# Patient Record
Sex: Male | Born: 1960 | Race: White | Hispanic: No | Marital: Married | Smoking: Former smoker
Health system: Southern US, Community
[De-identification: ages and names within clinical notes are randomized; demographics above are authoritative.]

## PROBLEM LIST (undated history)

## (undated) DIAGNOSIS — M1990 Unspecified osteoarthritis, unspecified site: Secondary | ICD-10-CM

## (undated) DIAGNOSIS — Z9889 Other specified postprocedural states: Secondary | ICD-10-CM

## (undated) DIAGNOSIS — R112 Nausea with vomiting, unspecified: Secondary | ICD-10-CM

## (undated) DIAGNOSIS — Z5189 Encounter for other specified aftercare: Secondary | ICD-10-CM

## (undated) DIAGNOSIS — E119 Type 2 diabetes mellitus without complications: Secondary | ICD-10-CM

---

## 1980-02-11 HISTORY — PX: VASECTOMY: SHX75

## 1998-10-09 ENCOUNTER — Inpatient Hospital Stay (HOSPITAL_COMMUNITY): Admission: RE | Admit: 1998-10-09 | Discharge: 1998-10-17 | Payer: Self-pay | Admitting: Neurosurgery

## 1998-10-09 ENCOUNTER — Encounter: Payer: Self-pay | Admitting: Neurosurgery

## 1999-02-11 HISTORY — PX: TYMPANIC MEMBRANE REPAIR: SHX294

## 1999-10-29 ENCOUNTER — Encounter: Payer: Self-pay | Admitting: Neurosurgery

## 1999-10-29 ENCOUNTER — Ambulatory Visit (HOSPITAL_COMMUNITY): Admission: RE | Admit: 1999-10-29 | Discharge: 1999-10-29 | Payer: Self-pay | Admitting: Neurosurgery

## 2003-02-11 HISTORY — PX: BACK SURGERY: SHX140

## 2004-07-16 ENCOUNTER — Emergency Department (HOSPITAL_COMMUNITY): Admission: EM | Admit: 2004-07-16 | Discharge: 2004-07-16 | Payer: Self-pay | Admitting: Emergency Medicine

## 2005-02-03 ENCOUNTER — Emergency Department (HOSPITAL_COMMUNITY): Admission: EM | Admit: 2005-02-03 | Discharge: 2005-02-03 | Payer: Self-pay | Admitting: Emergency Medicine

## 2006-10-21 ENCOUNTER — Encounter: Admission: RE | Admit: 2006-10-21 | Discharge: 2006-10-21 | Payer: Self-pay | Admitting: Orthopedic Surgery

## 2007-02-11 DIAGNOSIS — IMO0001 Reserved for inherently not codable concepts without codable children: Secondary | ICD-10-CM

## 2007-02-11 DIAGNOSIS — Z5189 Encounter for other specified aftercare: Secondary | ICD-10-CM

## 2007-02-11 HISTORY — PX: JOINT REPLACEMENT: SHX530

## 2007-02-11 HISTORY — DX: Encounter for other specified aftercare: Z51.89

## 2007-02-11 HISTORY — DX: Reserved for inherently not codable concepts without codable children: IMO0001

## 2007-04-09 ENCOUNTER — Ambulatory Visit (HOSPITAL_COMMUNITY): Admission: RE | Admit: 2007-04-09 | Discharge: 2007-04-09 | Payer: Self-pay | Admitting: Family Medicine

## 2007-06-03 ENCOUNTER — Inpatient Hospital Stay (HOSPITAL_COMMUNITY): Admission: RE | Admit: 2007-06-03 | Discharge: 2007-06-06 | Payer: Self-pay | Admitting: Orthopedic Surgery

## 2010-06-25 NOTE — Op Note (Signed)
NAME:  Richard Morris, Richard Morris               ACCOUNT NO.:  [id number]   MEDICAL RECORD NO.:  [id number]          PATIENT TYPE:  INP   LOCATION:  0004                         FACILITY:  [facility]   PHYSICIAN:  John L. Rendall, M.D.  DATE OF BIRTH:  11/18/1959   DATE OF PROCEDURE:  06/03/2007  DATE OF DISCHARGE:                               OPERATIVE REPORT   PREOPERATIVE DIAGNOSIS:  Osteoarthritis left hip.   POSTOPERATIVE DIAGNOSIS:  Osteoarthritis left hip.   SURGICAL PROCEDURES:  Left AML total hip replacement.   SURGEON:  John L. Rendall, M.D.   ASSISTANT:  Legrand Pitts. Duffy, P.A.   ANESTHESIA:  General.   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  He has a very vertical  femoral neck and it is longer than usual.   PROCEDURE:  Under general anesthesia, the patient is placed in the right  lateral decubitus position and the hip is prepared with DuraPrep.  posterior approach is made, splitting the IT band in the line of its  fibers using an approximate 6 inch incision.  Dissection is carried down  through skin, subcutaneous tissue and IT band, splitting it in the line  of its fibers inserting a Charnley retractor.  Short external rotators  and hip capsule are taken down from the bone at the femoral neck.  Capsule is teased from the short external rotators with a Cobb elevator  and then split in a T-shaped manner.  The hip is then dislocated after  bleeding is controlled.  The superior femoral neck is exposed and IM  initiator and canal finder are used.  Progressive reaming up to 13 mm is  performed.  Once this is done in preparation for a 13.5, the femoral  neck is osteotomized about an inch above the lesser trochanter.  At this  point, the femoral head is removed and rasping is done with 10.5 narrow,  12 and 13.5 and with a calcar reamer, the 13.5 gave a virtual perfect  seating on the calcar.  At this point, the  acetabulum is exposed with  cobra retractors inferiorly and wing retractors superiorly.  The labrum  is debrided.  The reamers are then used in sequence 45, 47, 49, 50, 51,  52 and 53.  Trial seating of a 52 bottoms out nicely.  Attempted seating  of a 54 gives excellent rim fit and at this point a #54 100 series  acetabular component is obtained using the Sputnik external guide.  It  is seated and leaves some exposed bone inferiorly laterally.  This is  soon found to cause subluxation of the hip on internal rotation and  despite several changes of neck lengths and trying the anteverted stem,  the acetabulum had to be redirected.  It is disimpacted and reinserted,  reinserting it only about 3 mm from full seating to see if the new  position was correct and it was not quite correct.  It was then reamed  to fit the more anatomic original acetabulum and with it seated in  this  manner, the Prodigy stem fit nicely with a +13  #32 femoral head inside  a 54 pinnacle cup with acetabular liner lipped inside diameter 32,  outside 54.  With excellent fitting of the trial reduction, the trial  acetabular poly is removed and a apex hole eliminator is inserted.  The  Marathon poly is then inserted.  The femoral component is then seated  nicely on the calcar and the +13 neck length hip ball 32 mm is impacted.  Care is taken to remove all debris from the acetabulum and hip is  reduced.  At this point excellent fit, alignment, stability are  obtained.  The hip capsule was preserved.  It is closed with #1 Tycron,  piriformis reattached with #1 Tycron, fascia closed with #1 Tycron,  subcu with #1 Vicryl, 2-0 Vicryl and skin clips.   OPERATIVE TIME:  About an hour and 45 minutes.   BLOOD LOSS:  Estimated at 400 mL.   The patient tolerated the procedure well and returned to recovery in  good condition.      John L. Rendall, M.D.  Electronically Signed     JLR/MEDQ  D:  06/03/2007  T:  06/03/2007   Job:  [id number]
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
## 2010-11-05 LAB — URINALYSIS, ROUTINE W REFLEX MICROSCOPIC
Glucose, UA: NEGATIVE
Hgb urine dipstick: NEGATIVE
pH: 7

## 2010-11-05 LAB — COMPREHENSIVE METABOLIC PANEL
Alkaline Phosphatase: 71
BUN: 10
GFR calc non Af Amer: >60
Glucose, Bld: 97
Potassium: 4.3
Total Bilirubin: 0.7
Total Protein: 7

## 2010-11-05 LAB — CROSSMATCH: ABO/RH(D): O POS

## 2010-11-05 LAB — CBC
HCT: 43.4
Hemoglobin: 14.9
MCHC: 34.3
MCHC: 35
Platelets: 216
RBC: 3.28 — ABNORMAL LOW
RBC: 3.3 — ABNORMAL LOW
RDW: 13.5
RDW: 14
WBC: 10.3
WBC: 9.3

## 2010-11-05 LAB — BASIC METABOLIC PANEL
BUN: 5 — ABNORMAL LOW
CO2: 27
Calcium: 8.2 — ABNORMAL LOW
Creatinine, Ser: 0.87
Creatinine, Ser: 0.94
GFR calc Af Amer: >60
GFR calc non Af Amer: >60
GFR calc non Af Amer: >60
Potassium: 3.8

## 2010-11-05 LAB — URINE CULTURE
Colony Count: NO GROWTH
Culture: NO GROWTH
Special Requests: NEGATIVE

## 2010-11-05 LAB — ABO/RH: ABO/RH(D): O POS

## 2010-11-05 LAB — POCT I-STAT 4, (NA,K, GLUC, HGB,HCT)
Glucose, Bld: 134 — ABNORMAL HIGH
HCT: 35 — ABNORMAL LOW
Hemoglobin: 11.9 — ABNORMAL LOW
Potassium: 4.4

## 2010-11-05 LAB — HEMOGLOBIN AND HEMATOCRIT, BLOOD
HCT: 27.3 — ABNORMAL LOW
Hemoglobin: 9.5 — ABNORMAL LOW

## 2010-11-05 LAB — DIFFERENTIAL
Eosinophils Absolute: 0.2
Eosinophils Relative: 3
Lymphocytes Relative: 33
Lymphs Abs: 2.5
Monocytes Absolute: 0.5

## 2011-05-02 ENCOUNTER — Other Ambulatory Visit: Payer: Self-pay | Admitting: Orthopaedic Surgery

## 2011-05-19 ENCOUNTER — Other Ambulatory Visit (HOSPITAL_COMMUNITY): Payer: Self-pay | Admitting: *Deleted

## 2011-05-20 ENCOUNTER — Encounter (HOSPITAL_COMMUNITY): Payer: Self-pay

## 2011-05-20 ENCOUNTER — Encounter (HOSPITAL_COMMUNITY)
Admission: RE | Admit: 2011-05-20 | Discharge: 2011-05-20 | Disposition: A | Discharge status: Home / Self Care | Payer: Managed Care, Other (non HMO) | Source: Ambulatory Visit | Attending: Orthopaedic Surgery | Admitting: Orthopaedic Surgery

## 2011-05-20 ENCOUNTER — Other Ambulatory Visit: Payer: Self-pay | Admitting: Orthopaedic Surgery

## 2011-05-20 ENCOUNTER — Encounter (HOSPITAL_COMMUNITY): Payer: Self-pay | Admitting: Pharmacy Technician

## 2011-05-20 HISTORY — DX: Nausea with vomiting, unspecified: R11.2

## 2011-05-20 HISTORY — DX: Unspecified osteoarthritis, unspecified site: M19.90

## 2011-05-20 HISTORY — DX: Encounter for other specified aftercare: Z51.89

## 2011-05-20 HISTORY — DX: Other specified postprocedural states: Z98.890

## 2011-05-20 LAB — TYPE AND SCREEN
ABO/RH(D): O POS
Antibody Screen: NEGATIVE

## 2011-05-20 LAB — BASIC METABOLIC PANEL
BUN: 13 mg/dL (ref 6–23)
CO2: 27 mEq/L (ref 19–32)
Calcium: 9.7 mg/dL (ref 8.4–10.5)
Chloride: 101 mEq/L (ref 96–112)
Creatinine, Ser: 0.97 mg/dL (ref 0.50–1.35)
GFR calc Af Amer: >90 mL/min (ref >90)
GFR calc non Af Amer: >90 mL/min (ref >90)
Glucose, Bld: 84 mg/dL (ref 70–99)
Potassium: 4.4 mEq/L (ref 3.5–5.1)
Sodium: 137 mEq/L (ref 135–145)

## 2011-05-20 LAB — URINALYSIS, ROUTINE W REFLEX MICROSCOPIC
Glucose, UA: NEGATIVE mg/dL
Ketones, ur: NEGATIVE mg/dL
Leukocytes, UA: NEGATIVE
Nitrite: NEGATIVE
Protein, ur: NEGATIVE mg/dL
Urobilinogen, UA: 0.2 mg/dL (ref 0.0–1.0)

## 2011-05-20 LAB — CBC
HCT: 41.5 % (ref 39.0–52.0)
Hemoglobin: 14.5 g/dL (ref 13.0–17.0)
MCH: 28.9 pg (ref 26.0–34.0)
MCHC: 34.9 g/dL (ref 30.0–36.0)
MCV: 82.7 fL (ref 78.0–100.0)
Platelets: 250 10*3/uL (ref 150–400)
RBC: 5.02 MIL/uL (ref 4.22–5.81)
RDW: 13.4 % (ref 11.5–15.5)
WBC: 7.9 10*3/uL (ref 4.0–10.5)

## 2011-05-20 LAB — DIFFERENTIAL
Basophils Absolute: 0.1 10*3/uL (ref 0.0–0.1)
Basophils Relative: 1 % (ref 0–1)
Eosinophils Absolute: 0.2 10*3/uL (ref 0.0–0.7)
Eosinophils Relative: 3 % (ref 0–5)
Lymphocytes Relative: 39 % (ref 12–46)
Lymphs Abs: 3.1 10*3/uL (ref 0.7–4.0)
Monocytes Absolute: 0.6 10*3/uL (ref 0.1–1.0)
Monocytes Relative: 8 % (ref 3–12)
Neutro Abs: 3.9 10*3/uL (ref 1.7–7.7)
Neutrophils Relative %: 49 % (ref 43–77)

## 2011-05-20 LAB — PROTIME-INR
INR: 0.93 (ref 0.00–1.49)
Prothrombin Time: 12.7 seconds (ref 11.6–15.2)

## 2011-05-20 LAB — SURGICAL PCR SCREEN
MRSA, PCR: NEGATIVE
Staphylococcus aureus: NEGATIVE

## 2011-05-20 LAB — APTT: aPTT: 32 seconds (ref 24–37)

## 2011-05-20 NOTE — Pre-Procedure Instructions (Signed)
20 Richard Morris  05/20/2011   Your procedure is scheduled on: Tuesday, April 16  Report to [facility] at 0530 AM.  Call this number if you have problems the morning of surgery: [phone]   Remember:   Do not eat food:After Midnight.  May have clear liquids: up to 4 Hours before arrival.(1:30 am)  Clear liquids include soda, tea, black coffee, apple or grape juice, broth.  Take these medicines the morning of surgery with A SIP OF WATER: *none**   Do not wear jewelry, make-up or nail polish.  Do not wear lotions, powders, or perfumes. You may wear deodorant.  Do not shave 48 hours prior to surgery.  Do not bring valuables to the hospital.  Contacts, dentures or bridgework may not be worn into surgery.  Leave suitcase in the car. After surgery it may be brought to your room.  For patients admitted to the hospital, checkout time is 11:00 AM the day of discharge.   Patients discharged the day of surgery will not be allowed to drive home.  Name and phone number of your driver: *n/a**  Special Instructions: Incentive Spirometry - Practice and bring it with you on the day of surgery. and CHG Shower Use Special Wash: 1/2 bottle night before surgery and 1/2 bottle morning of surgery.   Please read over the following fact sheets that you were given: Pain Booklet, Coughing and Deep Breathing, Blood Transfusion Information, Total Joint Packet, MRSA Information and Surgical Site Infection Prevention

## 2011-05-23 NOTE — H&P (Signed)
Richard Morris is an 51 y.o. male.   Chief Complaint: right knee pain HPI: Richard Morris is a patient well-known to our practice.  He is complaining of increasing right knee pain that has gotten to the point that it is severe and limiting his activities.  He is having pain with every step and night pain.  He has tried anti-inflammatory pills and cortisone injections all of which provided minimal benefit.  He has also had a series of Supartz done which helped only temporarily.  He has undergone a previous knee arthroscopy in 2011 which was positive for DJD. Radiographs:   AP, lateral and von Rosen views show bone-on-bone end-stage DJD of his right knee. We have discussed treatment options and he would like to proceed with a total knee replacement on this side.  Past Medical History  Diagnosis Date  . PONV (postoperative nausea and vomiting)   . Blood transfusion 2009    s/p hip replacement  . Arthritis     degenerative joint disease    Past Surgical History  Procedure Date  . Joint replacement 2009    left hip  . Back surgery 2005    spinal cyst  . Tympanic membrane repair 2001  . Vasectomy 1982    Family History  Problem Relation Age of Onset  . Anesthesia problems Neg Hx    Social History:  reports that he has quit smoking. His smoking use included Cigarettes. He has a 30 pack-year smoking history. He does not have any smokeless tobacco history on file. He reports that he drinks about 1.2 ounces of alcohol per week. He reports that he does not use illicit drugs.  Allergies: No Known Allergies  No current facility-administered medications on file as of .   No current outpatient prescriptions on file as of .    No results found for this or any previous visit (from the past 48 hour(s)). No results found.  Review of Systems  Constitutional: Negative.   HENT: Negative.   Respiratory: Negative.   Cardiovascular: Negative.   Genitourinary: Negative.   Musculoskeletal: Negative.     Skin: Negative.   Neurological: Negative.   Psychiatric/Behavioral: Negative.     There were no vitals taken for this visit. Physical Exam  Constitutional: He appears well-developed.  HENT:  Head: Normocephalic.  Eyes: Pupils are equal, round, and reactive to light.  Neck: Normal range of motion.  Cardiovascular: Normal heart sounds.   Respiratory: Effort normal.  GI: Soft. Bowel sounds are normal.  Musculoskeletal:       Right knee exam: Range of motion 0-105.  Pain along the medial joint line with 1+ crepitation.  Normal neurovascular status distally.  Neurological: He is alert.  Skin: Skin is warm.  Psychiatric: He has a normal mood and affect.     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  We have discussed complications which include anesthesia, infection, DVT and possible death.  He will be admitted postoperatively for pain control and therapy.  Synai Prettyman R 05/23/2011, 12:35 PM

## 2011-05-26 MED ORDER — CEFAZOLIN SODIUM-DEXTROSE 2-3 GM-% IV SOLR
2.0000 g | INTRAVENOUS | Status: AC
  Administered 2011-05-27: 2 g via INTRAVENOUS
  Filled 2011-05-26: qty 50

## 2011-05-26 MED ORDER — CHLORHEXIDINE GLUCONATE 4 % EX LIQD: 60.0000 mL | Freq: Once | CUTANEOUS | Status: DC

## 2011-05-27 ENCOUNTER — Encounter (HOSPITAL_COMMUNITY): Payer: Self-pay | Admitting: Anesthesiology

## 2011-05-27 ENCOUNTER — Inpatient Hospital Stay (HOSPITAL_COMMUNITY)
Admission: RE | Admit: 2011-05-27 | Discharge: 2011-05-30 | DRG: 470 | Disposition: A | Discharge status: Home Health | Payer: Managed Care, Other (non HMO) | Source: Ambulatory Visit | Attending: Orthopaedic Surgery | Admitting: Orthopaedic Surgery

## 2011-05-27 ENCOUNTER — Encounter (HOSPITAL_COMMUNITY): Payer: Self-pay | Admitting: *Deleted

## 2011-05-27 ENCOUNTER — Encounter (HOSPITAL_COMMUNITY)
Admission: RE | Disposition: A | Discharge status: Home Health | Payer: Self-pay | Source: Ambulatory Visit | Attending: Orthopaedic Surgery

## 2011-05-27 ENCOUNTER — Ambulatory Visit (HOSPITAL_COMMUNITY): Payer: Managed Care, Other (non HMO) | Admitting: Anesthesiology

## 2011-05-27 DIAGNOSIS — M1711 Unilateral primary osteoarthritis, right knee: Secondary | ICD-10-CM | POA: Diagnosis present

## 2011-05-27 DIAGNOSIS — Z7901 Long term (current) use of anticoagulants: Secondary | ICD-10-CM

## 2011-05-27 DIAGNOSIS — Z96649 Presence of unspecified artificial hip joint: Secondary | ICD-10-CM

## 2011-05-27 DIAGNOSIS — Z79899 Other long term (current) drug therapy: Secondary | ICD-10-CM

## 2011-05-27 DIAGNOSIS — Z87891 Personal history of nicotine dependence: Secondary | ICD-10-CM

## 2011-05-27 DIAGNOSIS — Z01818 Encounter for other preprocedural examination: Secondary | ICD-10-CM

## 2011-05-27 DIAGNOSIS — M1710 Unilateral primary osteoarthritis, unspecified knee: Principal | ICD-10-CM | POA: Diagnosis present

## 2011-05-27 DIAGNOSIS — M129 Arthropathy, unspecified: Secondary | ICD-10-CM | POA: Diagnosis present

## 2011-05-27 DIAGNOSIS — Z01812 Encounter for preprocedural laboratory examination: Secondary | ICD-10-CM

## 2011-05-27 HISTORY — PX: TOTAL KNEE ARTHROPLASTY: SHX125

## 2011-05-27 SURGERY — ARTHROPLASTY, KNEE, TOTAL
Anesthesia: General | Site: Knee | Laterality: Right | Wound class: Clean

## 2011-05-27 MED ORDER — LACTATED RINGERS IV SOLN: INTRAVENOUS | Status: DC

## 2011-05-27 MED ORDER — HYDROMORPHONE HCL PF 1 MG/ML IJ SOLN: 0.2500 mg | INTRAMUSCULAR | Status: DC | PRN

## 2011-05-27 MED ORDER — CEFUROXIME SODIUM 1.5 G IJ SOLR
INTRAMUSCULAR | Status: DC | PRN
  Administered 2011-05-27: 1.5 g

## 2011-05-27 MED ORDER — METOCLOPRAMIDE HCL 5 MG/ML IJ SOLN: 5.0000 mg | Freq: Three times a day (TID) | INTRAMUSCULAR | Status: DC | PRN

## 2011-05-27 MED ORDER — CEFAZOLIN SODIUM-DEXTROSE 2-3 GM-% IV SOLR
2.0000 g | Freq: Four times a day (QID) | INTRAVENOUS | Status: AC
  Administered 2011-05-27 – 2011-05-28 (×3): 2 g via INTRAVENOUS
  Filled 2011-05-27 (×3): qty 50

## 2011-05-27 MED ORDER — BISACODYL 5 MG PO TBEC: 5.0000 mg | DELAYED_RELEASE_TABLET | Freq: Every day | ORAL | Status: DC | PRN

## 2011-05-27 MED ORDER — LACTATED RINGERS IV SOLN
INTRAVENOUS | Status: DC | PRN
  Administered 2011-05-27 (×2): via INTRAVENOUS

## 2011-05-27 MED ORDER — ONDANSETRON HCL 4 MG/2ML IJ SOLN: 4.0000 mg | Freq: Four times a day (QID) | INTRAMUSCULAR | Status: DC | PRN

## 2011-05-27 MED ORDER — ACETAMINOPHEN 650 MG RE SUPP: 650.0000 mg | Freq: Four times a day (QID) | RECTAL | Status: DC | PRN

## 2011-05-27 MED ORDER — PHENOL 1.4 % MT LIQD: 1.0000 | OROMUCOSAL | Status: DC | PRN

## 2011-05-27 MED ORDER — DIPHENHYDRAMINE HCL 12.5 MG/5ML PO ELIX: 12.5000 mg | ORAL_SOLUTION | ORAL | Status: DC | PRN

## 2011-05-27 MED ORDER — WARFARIN - PHARMACIST DOSING INPATIENT: Freq: Every day | Status: DC

## 2011-05-27 MED ORDER — ROCURONIUM BROMIDE 100 MG/10ML IV SOLN
INTRAVENOUS | Status: DC | PRN
  Administered 2011-05-27: 50 mg via INTRAVENOUS

## 2011-05-27 MED ORDER — HYDROMORPHONE HCL PF 1 MG/ML IJ SOLN: 0.5000 mg | INTRAMUSCULAR | Status: DC | PRN

## 2011-05-27 MED ORDER — PATIENT'S GUIDE TO USING COUMADIN BOOK
Freq: Once | Status: AC
  Administered 2011-05-27: 18:00:00
  Filled 2011-05-27: qty 1

## 2011-05-27 MED ORDER — DEXAMETHASONE SODIUM PHOSPHATE 10 MG/ML IJ SOLN
INTRAMUSCULAR | Status: DC | PRN
  Administered 2011-05-27: 8 mg via INTRAVENOUS

## 2011-05-27 MED ORDER — ALUM & MAG HYDROXIDE-SIMETH 200-200-20 MG/5ML PO SUSP: 30.0000 mL | ORAL | Status: DC | PRN

## 2011-05-27 MED ORDER — METOCLOPRAMIDE HCL 10 MG PO TABS: 5.0000 mg | ORAL_TABLET | Freq: Three times a day (TID) | ORAL | Status: DC | PRN

## 2011-05-27 MED ORDER — MAGNESIUM CITRATE PO SOLN
1.0000 | Freq: Once | ORAL | Status: AC | PRN
  Filled 2011-05-27: qty 296

## 2011-05-27 MED ORDER — LACTATED RINGERS IV SOLN
INTRAVENOUS | Status: DC
  Administered 2011-05-27: 14:00:00 via INTRAVENOUS

## 2011-05-27 MED ORDER — DOCUSATE SODIUM 100 MG PO CAPS
100.0000 mg | ORAL_CAPSULE | Freq: Two times a day (BID) | ORAL | Status: DC
  Administered 2011-05-27 – 2011-05-30 (×6): 100 mg via ORAL
  Filled 2011-05-27 (×7): qty 1

## 2011-05-27 MED ORDER — GLYCOPYRROLATE 0.2 MG/ML IJ SOLN
INTRAMUSCULAR | Status: DC | PRN
  Administered 2011-05-27: .8 mg via INTRAVENOUS

## 2011-05-27 MED ORDER — METHOCARBAMOL 500 MG PO TABS
500.0000 mg | ORAL_TABLET | Freq: Four times a day (QID) | ORAL | Status: DC | PRN
  Administered 2011-05-27 – 2011-05-30 (×3): 500 mg via ORAL
  Filled 2011-05-27 (×3): qty 1

## 2011-05-27 MED ORDER — FENTANYL CITRATE 0.05 MG/ML IJ SOLN
INTRAMUSCULAR | Status: DC | PRN
  Administered 2011-05-27: 100 ug via INTRAVENOUS
  Administered 2011-05-27 (×2): 50 ug via INTRAVENOUS
  Administered 2011-05-27 (×2): 100 ug via INTRAVENOUS

## 2011-05-27 MED ORDER — METHOCARBAMOL 100 MG/ML IJ SOLN
500.0000 mg | Freq: Four times a day (QID) | INTRAVENOUS | Status: DC | PRN
  Filled 2011-05-27: qty 5

## 2011-05-27 MED ORDER — LIDOCAINE HCL (CARDIAC) 20 MG/ML IV SOLN
INTRAVENOUS | Status: DC | PRN
  Administered 2011-05-27: 100 mg via INTRAVENOUS

## 2011-05-27 MED ORDER — SODIUM CHLORIDE 0.9 % IR SOLN
Status: DC | PRN
  Administered 2011-05-27: 1000 mL
  Administered 2011-05-27: 3000 mL

## 2011-05-27 MED ORDER — ACETAMINOPHEN 325 MG PO TABS: 650.0000 mg | ORAL_TABLET | Freq: Four times a day (QID) | ORAL | Status: DC | PRN

## 2011-05-27 MED ORDER — ENOXAPARIN SODIUM 30 MG/0.3ML ~~LOC~~ SOLN
30.0000 mg | Freq: Two times a day (BID) | SUBCUTANEOUS | Status: DC
  Administered 2011-05-28 – 2011-05-30 (×5): 30 mg via SUBCUTANEOUS
  Filled 2011-05-27 (×8): qty 0.3

## 2011-05-27 MED ORDER — PROPOFOL 10 MG/ML IV EMUL
INTRAVENOUS | Status: DC | PRN
  Administered 2011-05-27: 200 mg via INTRAVENOUS

## 2011-05-27 MED ORDER — NEOSTIGMINE METHYLSULFATE 1 MG/ML IJ SOLN
INTRAMUSCULAR | Status: DC | PRN
  Administered 2011-05-27: 5 mg via INTRAVENOUS

## 2011-05-27 MED ORDER — MIDAZOLAM HCL 5 MG/5ML IJ SOLN
INTRAMUSCULAR | Status: DC | PRN
  Administered 2011-05-27 (×2): 2 mg via INTRAVENOUS

## 2011-05-27 MED ORDER — WARFARIN SODIUM 10 MG PO TABS
10.0000 mg | ORAL_TABLET | Freq: Once | ORAL | Status: AC
  Administered 2011-05-27: 10 mg via ORAL
  Filled 2011-05-27: qty 1

## 2011-05-27 MED ORDER — ONDANSETRON HCL 4 MG PO TABS: 4.0000 mg | ORAL_TABLET | Freq: Four times a day (QID) | ORAL | Status: DC | PRN

## 2011-05-27 MED ORDER — ONDANSETRON HCL 4 MG/2ML IJ SOLN: 4.0000 mg | Freq: Once | INTRAMUSCULAR | Status: DC | PRN

## 2011-05-27 MED ORDER — ACETAMINOPHEN 10 MG/ML IV SOLN
INTRAVENOUS | Status: AC
  Filled 2011-05-27: qty 100

## 2011-05-27 MED ORDER — OXYCODONE-ACETAMINOPHEN 5-325 MG PO TABS
1.0000 | ORAL_TABLET | ORAL | Status: DC | PRN
  Administered 2011-05-27 – 2011-05-29 (×7): 2 via ORAL
  Administered 2011-05-29: 1 via ORAL
  Administered 2011-05-30 (×2): 2 via ORAL
  Filled 2011-05-27 (×4): qty 2
  Filled 2011-05-27: qty 1
  Filled 2011-05-27 (×5): qty 2

## 2011-05-27 MED ORDER — MENTHOL 3 MG MT LOZG: 1.0000 | LOZENGE | OROMUCOSAL | Status: DC | PRN

## 2011-05-27 MED ORDER — ONDANSETRON HCL 4 MG/2ML IJ SOLN
INTRAMUSCULAR | Status: DC | PRN
  Administered 2011-05-27 (×2): 4 mg via INTRAVENOUS

## 2011-05-27 MED ORDER — WARFARIN VIDEO: Freq: Once | Status: DC

## 2011-05-27 SURGICAL SUPPLY — 66 items
AUTOTRANSFUSION W/QD PVC DRAIN (AUTOTRANSFUSION) ×2 IMPLANT
BANDAGE ELASTIC 4 VELCRO ST LF (GAUZE/BANDAGES/DRESSINGS) ×2 IMPLANT
BANDAGE ESMARK 6X9 LF (GAUZE/BANDAGES/DRESSINGS) ×1 IMPLANT
BANDAGE GAUZE ELAST BULKY 4 IN (GAUZE/BANDAGES/DRESSINGS) ×2 IMPLANT
BLADE SAGITTAL 25.0X1.19X90 (BLADE) ×2 IMPLANT
BLADE SURG ROTATE 9660 (MISCELLANEOUS) IMPLANT
BNDG ELASTIC 6X10 VLCR STRL LF (GAUZE/BANDAGES/DRESSINGS) ×2 IMPLANT
BNDG ESMARK 6X9 LF (GAUZE/BANDAGES/DRESSINGS) ×2
BOWL SMART MIX CTS (DISPOSABLE) ×2 IMPLANT
CEMENT HV SMART SET (Cement) ×4 IMPLANT
CLOTH BEACON ORANGE TIMEOUT ST (SAFETY) ×2 IMPLANT
COVER SURGICAL LIGHT HANDLE (MISCELLANEOUS) ×2 IMPLANT
CUFF TOURNIQUET SINGLE 34IN LL (TOURNIQUET CUFF) ×2 IMPLANT
CUFF TOURNIQUET SINGLE 44IN (TOURNIQUET CUFF) IMPLANT
DRAPE EXTREMITY T 121X128X90 (DRAPE) ×2 IMPLANT
DRAPE PROXIMA HALF (DRAPES) ×2 IMPLANT
DRAPE U-SHAPE 47X51 STRL (DRAPES) ×2 IMPLANT
DRSG ADAPTIC 3X8 NADH LF (GAUZE/BANDAGES/DRESSINGS) ×2 IMPLANT
DRSG PAD ABDOMINAL 8X10 ST (GAUZE/BANDAGES/DRESSINGS) ×2 IMPLANT
DURAPREP 26ML APPLICATOR (WOUND CARE) ×2 IMPLANT
ELECT REM PT RETURN 9FT ADLT (ELECTROSURGICAL) ×2
ELECTRODE REM PT RTRN 9FT ADLT (ELECTROSURGICAL) ×1 IMPLANT
EVACUATOR 1/8 PVC DRAIN (DRAIN) ×2 IMPLANT
FACESHIELD LNG OPTICON STERILE (SAFETY) ×2 IMPLANT
GLOVE BIO SURGEON STRL SZ8.5 (GLOVE) ×2 IMPLANT
GLOVE BIOGEL PI IND STRL 7.5 (GLOVE) ×1 IMPLANT
GLOVE BIOGEL PI IND STRL 8 (GLOVE) ×1 IMPLANT
GLOVE BIOGEL PI IND STRL 8.5 (GLOVE) ×1 IMPLANT
GLOVE BIOGEL PI INDICATOR 7.5 (GLOVE) ×1
GLOVE BIOGEL PI INDICATOR 8 (GLOVE) ×1
GLOVE BIOGEL PI INDICATOR 8.5 (GLOVE) ×1
GLOVE EXAM NITRILE LRG STRL (GLOVE) ×2 IMPLANT
GLOVE SS BIOGEL STRL SZ 8 (GLOVE) ×1 IMPLANT
GLOVE SUPERSENSE BIOGEL SZ 8 (GLOVE) ×1
GLOVE SURG SS PI 7.5 STRL IVOR (GLOVE) ×6 IMPLANT
GOWN PREVENTION PLUS XLARGE (GOWN DISPOSABLE) ×2 IMPLANT
GOWN PREVENTION PLUS XXLARGE (GOWN DISPOSABLE) ×2 IMPLANT
GOWN SRG XL XLNG 56XLVL 4 (GOWN DISPOSABLE) ×1 IMPLANT
GOWN STRL NON-REIN LRG LVL3 (GOWN DISPOSABLE) IMPLANT
GOWN STRL NON-REIN XL XLG LVL4 (GOWN DISPOSABLE) ×1
HANDPIECE INTERPULSE COAX TIP (DISPOSABLE) ×1
HOOD PEEL AWAY FACE SHEILD DIS (HOOD) ×2 IMPLANT
IMMOBILIZER KNEE 20 (SOFTGOODS)
IMMOBILIZER KNEE 20 THIGH 36 (SOFTGOODS) IMPLANT
IMMOBILIZER KNEE 22 UNIV (SOFTGOODS) ×2 IMPLANT
IMMOBILIZER KNEE 24 THIGH 36 (MISCELLANEOUS) IMPLANT
IMMOBILIZER KNEE 24 UNIV (MISCELLANEOUS)
KIT BASIN OR (CUSTOM PROCEDURE TRAY) ×2 IMPLANT
KIT ROOM TURNOVER OR (KITS) ×2 IMPLANT
MANIFOLD NEPTUNE II (INSTRUMENTS) ×2 IMPLANT
NS IRRIG 1000ML POUR BTL (IV SOLUTION) ×2 IMPLANT
PACK TOTAL JOINT (CUSTOM PROCEDURE TRAY) ×2 IMPLANT
PAD ARMBOARD 7.5X6 YLW CONV (MISCELLANEOUS) ×4 IMPLANT
SET HNDPC FAN SPRY TIP SCT (DISPOSABLE) ×1 IMPLANT
SPONGE GAUZE 4X4 12PLY (GAUZE/BANDAGES/DRESSINGS) ×2 IMPLANT
STAPLER VISISTAT 35W (STAPLE) ×2 IMPLANT
SUCTION FRAZIER TIP 10 FR DISP (SUCTIONS) ×2 IMPLANT
SUT VIC AB 0 CT1 27 (SUTURE) ×2
SUT VIC AB 0 CT1 27XBRD ANBCTR (SUTURE) ×2 IMPLANT
SUT VIC AB 1 CTB1 27 (SUTURE) ×4 IMPLANT
SUT VIC AB 2-0 CT1 27 (SUTURE) ×2
SUT VIC AB 2-0 CT1 TAPERPNT 27 (SUTURE) ×2 IMPLANT
TOWEL OR 17X24 6PK STRL BLUE (TOWEL DISPOSABLE) ×2 IMPLANT
TOWEL OR 17X26 10 PK STRL BLUE (TOWEL DISPOSABLE) ×2 IMPLANT
TRAY FOLEY CATH 14FR (SET/KITS/TRAYS/PACK) ×2 IMPLANT
WATER STERILE IRR 1000ML POUR (IV SOLUTION) ×2 IMPLANT

## 2011-05-27 NOTE — Anesthesia Preprocedure Evaluation (Addendum)
Anesthesia Evaluation  Patient identified by MRN, date of birth, ID band Patient awake    History of Anesthesia Complications (+) PONV  Airway Mallampati: II TM Distance: >3 FB Neck ROM: Full    Dental  (+) Edentulous Upper and Dental Advisory Given   Pulmonary          Cardiovascular     Neuro/Psych    GI/Hepatic   Endo/Other    Renal/GU      Musculoskeletal  (+) Arthritis -, Osteoarthritis,    Abdominal   Peds  Hematology   Anesthesia Other Findings   Reproductive/Obstetrics                           Anesthesia Physical Anesthesia Plan  ASA: II  Anesthesia Plan: General   Post-op Pain Management:    Induction: Intravenous  Airway Management Planned: Oral ETT  Additional Equipment:   Intra-op Plan:   Post-operative Plan: Extubation in OR  Informed Consent: I have reviewed the patients History and Physical, chart, labs and discussed the procedure including the risks, benefits and alternatives for the proposed anesthesia with the patient or authorized representative who has indicated his/her understanding and acceptance.   Dental advisory given  Plan Discussed with: Anesthesiologist and Surgeon  Anesthesia Plan Comments:         Anesthesia Quick Evaluation

## 2011-05-27 NOTE — Op Note (Signed)
PREOP DIAGNOSIS: DJD RIGHT KNEE POSTOP DIAGNOSIS: DJD RIGHT KNEE PROCEDURE: RIGHT TKR ANESTHESIA: General and block ATTENDING SURGEON: Chin Wachter G ASSISTANT: Lindwood Qua PA  INDICATIONS FOR PROCEDURE: Richard Morris is a 51 y.o. male who has struggled for a long time with pain due to degenerative arthritis of the right knee.  The patient has failed many conservative non-operative measures and at this point has pain which limits the ability to sleep and walk.  The patient is offered total knee replacement.  Informed operative consent was obtained after discussion of possible risks of anesthesia, infection, neurovascular injury, DVT, and death.  The importance of the post-operative rehabilitation protocol to optimize result was stressed extensively with the patient.  SUMMARY OF FINDINGS AND PROCEDURE:  EDUARD PENKALA was taken to the operative suite where under the above anesthesia a right knee replacement was performed.  There were advanced degenerative changes and the bone quality was excellent.  We used the DePuy system and placed size standard plus femur, 5 tibia, 38mm all polyethylene patella, and a size 10mm spacer.  I did include Zinacef antibiotic in the cement.  The patient was admitted for appropriate post-op care to include perioperative antibiotics and mechanical and pharmacologic measures for DVT prophylaxis.  DESCRIPTION OF PROCEDURE:  VENCE LALOR was taken to the operative suite where the above anesthesia was applied.  The patient was positioned supine and prepped and draped in normal sterile fashion.  An appropriate time out was performed.  After the administration of Kefzol pre-op antibiotic a standard longitudinal incision was made on the anterior knee.  Dissection was carried down to the extensor mechanism.  All appropriate anti-infective measures were used including the pre-operative antibiotic, betadine impregnated drape, and closed hooded exhaust systems for each member  of the surgical team.  A medial parapatellar incision was made in the extensor mechanism and the knee cap flipped and the knee flexed.  Some residual meniscal tissues were removed along with any remaining ACL/PCL tissue.  A guide was placed on the tibia and a flat cut was made on it's superior surface.  An intramedullary guide was placed in the femur and was utilized to make anterior and posterior cuts creating an appropriate flexion gap.  A second intramedullary guide was placed in the femur to make a distal cut properly balancing the knee with an extension gap equal to the flexion gap.  The three bones sized to the above mentioned sizes and the appropriate guides were placed and utilized.  A trial reduction was done and the knee easily came to full extension and the patella tracked well on flexion.  The trial components were removed and all bones were cleaned with pulsatile lavage and then dried thoroughly.  Cement was mixed including antibiotic and was pressurized onto the bones followed by placement of the aforementioned components.  Excess cement was trimmed and pressure was held on the components until the cement had hardened.  The tourniquet was deflated and a small amount of bleeding was controlled with cautery and pressure.  The knee was irrigated and a drain placed exiting superolaterally.  The extensor mechanism was re-approximated with #1 suture.  The knee was flexed and the repair was solid.  The subcutaneous tissues were re-approximated with #0 and #2-0 vicryl and the skin closed with staples.  A sterile dressing was applied.  Intraoperative fluids, EBL, and tourniquet time can be obtained from anesthesia records.  DISPOSITION:  The patient was taken to recovery room in stable condition and  admitted for appropriate post-op care to include peri-operative antibiotic and DVT prophylaxis with mechanical and pharmacologic measures.  Troi Florendo G 05/27/2011, 9:34 AM

## 2011-05-27 NOTE — Progress Notes (Signed)
ANTICOAGULATION CONSULT NOTE - Initial Consult  Pharmacy Consult for coumadin Indication: VTE prophylaxis  No Known Allergies  Patient Measurements: Weight: 229 lb 8 oz (104.1 kg)   Vital Signs: Temp: 97.4 F (36.3 C) (04/16 1155) Temp src: Oral (04/16 1155) BP: 137/79 mmHg (04/16 1155) Pulse Rate: 71  (04/16 1155)  Labs: No results found for this basename: HGB:2,HCT:3,PLT:3,APTT:3,LABPROT:3,INR:3,HEPARINUNFRC:3,CREATININE:3,CKTOTAL:3,CKMB:3,TROPONINI:3 in the last 72 hours CrCl is unknown because there is no height on file for the current visit.  Medical History: Past Medical History  Diagnosis Date  . PONV (postoperative nausea and vomiting)   . Blood transfusion 2009    s/p hip replacement  . Arthritis     degenerative joint disease    Medications:  Prescriptions prior to admission  Medication Sig Dispense Refill  . Alfalfa 500 MG TABS Take 1 tablet by mouth 3 (three) times daily.      . celecoxib (CELEBREX) 200 MG capsule Take 200 mg by mouth daily.      . cholecalciferol (VITAMIN D) 1000 UNITS tablet Take 1,000 Units by mouth daily.      Marland Kitchen omega-3 acid ethyl esters (LOVAZA) 1 G capsule Take 1 g by mouth 3 (three) times daily.      Marland Kitchen OVER THE COUNTER MEDICATION Take 1 tablet by mouth 2 (two) times daily. Over The Counter Vinegar and Honey      . vitamin B-12 (CYANOCOBALAMIN) 500 MCG tablet Take 500 mcg by mouth daily.      . vitamin C (ASCORBIC ACID) 500 MG tablet Take 500 mg by mouth daily.        Assessment: 51 yo WM s/p R TKR to rec coumadin for VTE px. Coumadin score = 8. Labs wnl.   Goal of Therapy:  INR 2-3   Plan: 1. Coumadin 10 mg po x 1 dose today 2. To rec LMWH 30 q12h 3. Coumadin book/video 4. Daily INR Herby Abraham, Pharm.D. [phone] 05/27/2011 1:25 PM

## 2011-05-27 NOTE — Anesthesia Procedure Notes (Addendum)
Procedure Name: Intubation Date/Time: 05/27/2011 7:50 AM Performed by: Sherie Don Pre-anesthesia Checklist: Patient identified, Emergency Drugs available, Suction available, Patient being monitored and Timeout performed Patient Re-evaluated:Patient Re-evaluated prior to inductionOxygen Delivery Method: Circle system utilized Preoxygenation: Pre-oxygenation with 100% oxygen Intubation Type: IV induction Ventilation: Oral airway inserted - appropriate to patient size and Two handed mask ventilation required Laryngoscope Size: Mac and 3 Grade View: Grade II Tube type: Oral Tube size: 7.5 mm Number of attempts: 1 Airway Equipment and Method: Stylet Placement Confirmation: ETT inserted through vocal cords under direct vision,  positive ETCO2 and breath sounds checked- equal and bilateral Secured at: 22 cm Tube secured with: Tape Dental Injury: Teeth and Oropharynx as per pre-operative assessment    Anesthesia Regional Block:  Femoral nerve block  Pre-Anesthetic Checklist: ,, timeout performed, Correct Patient, Correct Site, Correct Laterality, Correct Procedure, Correct Position, site marked, Risks and benefits discussed,  Surgical consent,  Pre-op evaluation,  At surgeon's request and post-op pain management  Laterality: Right  Prep: chloraprep       Needles:   Needle Type: Echogenic Stimulator Needle     Needle Length: 10cm 10 cm Needle Gauge: 22 and 22 G    Additional Needles:  Procedures: ultrasound guided Femoral nerve block Narrative:  Start time: 05/27/2011 7:20 AM End time: 05/27/2011 7:30 AM  Additional Notes: 30 cc 0.5% marcaine 1:200 epi    Femoral nerve block

## 2011-05-27 NOTE — Transfer of Care (Signed)
Immediate Anesthesia Transfer of Care Note  Patient: Richard Morris  Procedure(s) Performed: Procedure(s) (LRB): TOTAL KNEE ARTHROPLASTY (Right)  Patient Location: PACU  Anesthesia Type: General  Level of Consciousness: sedated  Airway & Oxygen Therapy: Patient Spontanous Breathing and Patient connected to face mask oxygen  Post-op Assessment: Report given to PACU RN and Post -op Vital signs reviewed and stable  Post vital signs: Reviewed and stable  Complications: No apparent anesthesia complications2

## 2011-05-27 NOTE — Anesthesia Postprocedure Evaluation (Signed)
  Anesthesia Post-op Note  Patient: Richard Morris  Procedure(s) Performed: Procedure(s) (LRB): TOTAL KNEE ARTHROPLASTY (Right)  Patient Location: PACU  Anesthesia Type: General and GA combined with regional for post-op pain  Level of Consciousness: awake, alert  and oriented  Airway and Oxygen Therapy: Patient Spontanous Breathing and Patient connected to nasal cannula oxygen  Post-op Pain: none  Post-op Assessment: Post-op Vital signs reviewed and Patient's Cardiovascular Status Stable  Post-op Vital Signs: stable  Complications: No apparent anesthesia complications

## 2011-05-27 NOTE — Preoperative (Signed)
Beta Blockers   Reason not to administer Beta Blockers:Not Applicable 

## 2011-05-27 NOTE — Interval H&P Note (Signed)
History and Physical Interval Note:  05/27/2011 7:28 AM  Richard Morris  has presented today for surgery, with the diagnosis of DEGENERATIVE JOINT DISEASE RIGHT KNEE  The various methods of treatment have been discussed with the patient and family. After consideration of risks, benefits and other options for treatment, the patient has consented to  Procedure(s) (LRB): TOTAL KNEE ARTHROPLASTY (Right) as a surgical intervention .  The patients' history has been reviewed, patient examined, no change in status, stable for surgery.  I have reviewed the patients' chart and labs.  Questions were answered to the patient's satisfaction.     [name]

## 2011-05-27 NOTE — Plan of Care (Signed)
Problem: Consults Goal: Diagnosis- Total Joint Replacement Primary Total Knee RIGHT     

## 2011-05-28 ENCOUNTER — Encounter (HOSPITAL_COMMUNITY): Payer: Self-pay | Admitting: Orthopaedic Surgery

## 2011-05-28 LAB — BASIC METABOLIC PANEL
BUN: 12 mg/dL (ref 6–23)
Chloride: 106 mEq/L (ref 96–112)
GFR calc Af Amer: >90 mL/min (ref >90)
Potassium: 4 mEq/L (ref 3.5–5.1)

## 2011-05-28 LAB — CBC
HCT: 35.6 % — ABNORMAL LOW (ref 39.0–52.0)
Hemoglobin: 12.5 g/dL — ABNORMAL LOW (ref 13.0–17.0)
RDW: 13.7 % (ref 11.5–15.5)
WBC: 14.7 10*3/uL — ABNORMAL HIGH (ref 4.0–10.5)

## 2011-05-28 LAB — PROTIME-INR: INR: 1.1 (ref 0.00–1.49)

## 2011-05-28 LAB — GLUCOSE, CAPILLARY: Glucose-Capillary: 108 mg/dL — ABNORMAL HIGH (ref 70–99)

## 2011-05-28 MED ORDER — WARFARIN SODIUM 10 MG PO TABS
10.0000 mg | ORAL_TABLET | Freq: Once | ORAL | Status: AC
  Administered 2011-05-28: 10 mg via ORAL
  Filled 2011-05-28: qty 1

## 2011-05-28 MED ORDER — WARFARIN VIDEO: Freq: Once | Status: DC

## 2011-05-28 MED ORDER — LACTATED RINGERS IV SOLN: INTRAVENOUS | Status: DC

## 2011-05-28 MED ORDER — CEFAZOLIN SODIUM-DEXTROSE 2-3 GM-% IV SOLR
2.0000 g | Freq: Once | INTRAVENOUS | Status: AC
  Administered 2011-05-28: 2 g via INTRAVENOUS
  Filled 2011-05-28: qty 50

## 2011-05-28 NOTE — Evaluation (Addendum)
Physical Therapy Evaluation Patient Details Name: Richard Morris MRN: [id number] DOB: 1960/05/19 Today's Date: 05/28/2011  Problem List:  Patient Active Problem List  Diagnoses  . Right knee DJD    Past Medical History:  Past Medical History  Diagnosis Date  . PONV (postoperative nausea and vomiting)   . Blood transfusion 2009    s/p hip replacement  . Arthritis     degenerative joint disease   Past Surgical History:  Past Surgical History  Procedure Date  . Joint replacement 2009    left hip  . Back surgery 2005    spinal cyst  . Tympanic membrane repair 2001  . Vasectomy 1982  . 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  On eval., pt with pain and mild weakness incl  lag in terminal extension and limited knee flexion to ~80 degrees. Recommend HHPT after D/C PT Recommendation/Assessment: Patient will need skilled PT in the acute care venue PT Problem List: Decreased strength;Decreased range of motion;Decreased activity tolerance;Decreased mobility;Decreased knowledge of use of DME;Pain PT Therapy Diagnosis : Acute pain;Generalized weakness PT Plan PT Frequency: 7X/week PT Treatment/Interventions: DME instruction;Gait training;Functional mobility training;Stair training;Therapeutic exercise;Therapeutic activities;Patient/family education PT Recommendation Follow Up Recommendations: Home health PT Equipment Recommended: None recommended by PT PT Goals  Acute Rehab PT Goals PT Goal Formulation: With patient Time For Goal Achievement: 7 days Pt will go Supine/Side to Sit: with HOB 0 degrees;with modified independence PT Goal: Supine/Side to Sit - Progress: Goal set today Pt will go Sit to Stand: with modified independence PT Goal:  Sit to Stand - Progress: Goal set today Pt will Transfer Bed to Chair/Chair to Bed: with modified independence PT Transfer Goal: Bed to Chair/Chair to Bed - Progress: Goal set today Pt will Ambulate: >150 feet;with modified independence;with least restrictive assistive device PT Goal: Ambulate - Progress: Goal set today Pt will Go Up / Down Stairs: 1-2 stairs;with modified independence;with rail(s) PT Goal: Up/Down Stairs - Progress: Goal set today  PT Evaluation Precautions/Restrictions  Precautions Required Braces or Orthoses: Knee Immobilizer - Right Restrictions Weight Bearing Restrictions: Yes RLE Weight Bearing: Weight bearing as tolerated Prior Functioning  Home Living Lives With: Spouse Available Help at Discharge: Family Type of Home: House Home Access: Stairs to enter Secretary/administrator of Steps: 3 Entrance Stairs-Rails: Can reach both Home Layout: Two level;Able to live on main level with bedroom/bathroom;Full bath on main level Bathroom Shower/Tub: Tub/shower unit;Door Bathroom Toilet: Standard Home Adaptive Equipment: Grab bars in shower;Grab bars around toilet;Walker - rolling;Straight cane;Other (comment) Prior Function Level of Independence: Independent Able to Take Stairs?: Yes Driving: Yes Vocation: Full time employment Cognition Cognition Arousal/Alertness: Awake/alert Overall Cognitive Status: Appears within functional limits for tasks assessed Orientation Level: Oriented X4 Sensation/Coordination Sensation Light Touch: Appears Intact Coordination Gross Motor Movements are Fluid and Coordinated: Yes Fine Motor Movements are Fluid and Coordinated: Yes Extremity Assessment RUE Assessment RUE Assessment: Within Functional Limits LUE Assessment LUE Assessment: Within Functional Limits RLE Assessment RLE Assessment: Exceptions to [facility] RLE PROM (degrees) Right Knee Flexion 0-140: 80  LLE Assessment LLE Assessment: Within Functional Limits Mobility  (including Balance) Bed Mobility Bed Mobility: Yes Supine to Sit: 4: Min assist;Other (comment) (at R LE) Sitting - Scoot to Edge of Bed: 5: Supervision Transfers Transfers: Yes Sit  to Stand: Other (comment);From bed;With upper extremity assist (min guard A) Stand to Sit: To chair/3-in-1;With upper extremity assist;Other (comment) (min guard A) Ambulation/Gait Ambulation/Gait: Yes Ambulation/Gait Assistance: Other (comment) (min guard A) Ambulation Distance (Feet): 80 Feet Assistive device: Rolling walker Gait Pattern: Step-to pattern Stairs: No  Posture/Postural Control Posture/Postural Control: No significant limitations Balance Balance Assessed:  (supervision statc/dynamic sitting) Exercise  Total Joint Exercises Ankle Circles/Pumps: AROM;15 reps;Supine Quad Sets: AROM;Both;10 reps;Seated Heel Slides: AAROM;Right;10 reps;Supine Long Arc Quad: AROM;Right;10 reps;Seated End of Session PT - End of Session Activity Tolerance: Patient tolerated treatment well Patient left: in chair;with call bell in reach Nurse Communication: Mobility status for transfers;Mobility status for ambulation General Behavior During Session: [facility] for tasks performed Cognition: [facility] for tasks performed  Jerimey Burridge, Eliseo Gum 05/28/2011, 2:18 PM  05/28/2011  [place] Bing, PT [phone] (pager)

## 2011-05-28 NOTE — Progress Notes (Signed)
Subjective: 1 Day Post-Op Procedure(s) (LRB): TOTAL KNEE ARTHROPLASTY (Right)  Activity level:  oob with therapy  wbat Diet tolerance:  eating Voiding:  Foley out Patient reports pain as 1 on 0-10 scale.    Objective: Vital signs in last 24 hours: Temp:  [97 F (36.1 C)-98.5 F (36.9 C)] 97.3 F (36.3 C) (04/17 0600) Pulse Rate:  [71-106] 82  (04/17 0600) Resp:  [14-27] 18  (04/17 0600) BP: (110-151)/(65-88) 123/77 mmHg (04/17 0600) SpO2:  [95 %-100 %] 100 % (04/17 0600) Weight:  [104.1 kg (229 lb 8 oz)] 104.1 kg (229 lb 8 oz) (04/16 1155)  Labs:  Basename 05/28/11 0630  HGB 12.5*    Basename 05/28/11 0630  WBC 14.7*  RBC 4.21*  HCT 35.6*  PLT 271   No results found for this basename: NA:2,K:2,CL:2,CO2:2,BUN:2,CREATININE:2,GLUCOSE:2,CALCIUM:2 in the last 72 hours  Basename 05/28/11 0630  LABPT --  INR 1.10    Physical Exam:  Neurologically intact ABD soft Neurovascular intact Sensation intact distally Intact pulses distally Dorsiflexion/Plantar flexion intact No cellulitis present Compartment soft Drain in - will pull thurs.  Assessment/Plan:  1 Day Post-Op Procedure(s) (LRB): TOTAL KNEE ARTHROPLASTY (Right) Advance diet Up with therapy D/C IV fluids Discharge home with home health on fri  Will need HH pt and coumadin protocol     [place] Landgrebe R 05/28/2011, 8:03 AM

## 2011-05-28 NOTE — Progress Notes (Signed)
05/28/11 1700  PT Visit Information  Last PT Received On 05/28/11  Precautions  Required Braces or Orthoses Knee Immobilizer - Right  Knee Immobilizer - Right Discontinue once straight leg raise with < 10 degree lag  Restrictions  RLE Weight Bearing WBAT  Transfers  Sit to Stand From chair/3-in-1;With armrests;With upper extremity assist;Other (comment) (min guard Assist)  Stand to Sit Other (comment);To chair/3-in-1 (min guard A)  Ambulation/Gait  Ambulation/Gait Yes  Ambulation/Gait Assistance Other (comment) (min guard A)  Ambulation/Gait Assistance Details (indicate cue type and reason) vc's for sequencing only  Ambulation Distance (Feet) 150 Feet  Assistive device Rolling walker  Gait Pattern Step-to pattern  Stairs No  Posture/Postural Control  Posture/Postural Control No significant limitations  Exercises  Exercises Total Joint  Total Joint Exercises  Ankle Circles/Pumps AROM;15 reps;Right;Seated  Quad Sets AROM;15 reps;Right;Seated  Heel Slides AAROM;Right;10 reps;Seated  Straight Leg Raises AAROM;10 reps;Right;Seated  Knee Flexion AAROM;Right;10 reps;Seated  PT - End of Session  Equipment Utilized During Treatment Right knee immobilizer  Activity Tolerance Patient tolerated treatment well  Patient left in chair;with call bell in reach  Nurse Communication Mobility status for transfers;Mobility status for ambulation  General  Behavior During Session [facility] for tasks performed  Cognition [facility] for tasks performed  PT - Assessment/Plan  Comments on Treatment Session progressed well, Knee flexion AAROM 80 degrees  PT Plan Discharge plan remains appropriate  Follow Up Recommendations Home health PT  Equipment Recommended None recommended by PT  Acute Rehab PT Goals  PT Goal: Supine/Side to Sit - Progress Progressing toward goal  PT Goal: Sit to Stand - Progress Progressing toward goal  PT Transfer Goal: Bed to Chair/Chair to Bed - Progress Progressing toward goal  PT  Goal: Ambulate - Progress Progressing toward goal

## 2011-05-28 NOTE — Progress Notes (Signed)
Occupational Therapy Evaluation Patient Details Name: Richard Morris MRN: [id number] DOB: Nov 19, 1960 Today's Date: 05/28/2011  Problem List:  Patient Active Problem List  Diagnoses  . Right knee DJD    Past Medical History:  Past Medical History  Diagnosis Date  . PONV (postoperative nausea and vomiting)   . Blood transfusion 2009    s/p hip replacement  . Arthritis     degenerative joint disease   Past Surgical History:  Past Surgical History  Procedure Date  . Joint replacement 2009    left hip  . Back surgery 2005    spinal cyst  . Tympanic membrane repair 2001  . Vasectomy 1982  . 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 All education regarding ADL retraining and DME completed. No further OT needs. OT Recommendation/Assessment: Patient does not need any further OT services OT Recommendation Follow Up Recommendations: No OT follow up Equipment Recommended: None recommended by OT OT Goals Acute Rehab OT Goals OT Goal Formulation:  (eval only)  OT Evaluation Precautions/Restrictions  Precautions Required Braces or Orthoses: Knee Immobilizer - Right Knee Immobilizer - Right: Discontinue once straight leg raise with < 10 degree lag Restrictions Weight Bearing Restrictions: Yes RLE Weight Bearing: Weight bearing as tolerated Prior Functioning Home Living Lives With: Spouse Available Help at Discharge: Family Type of Home: House Home Access: Stairs to enter Secretary/administrator of Steps: 3 Entrance Stairs-Rails: Can reach both Home Layout: Two level;Able to live on main level with bedroom/bathroom;Full bath on main level Bathroom Shower/Tub: Tub/shower unit;Door [employer] Toilet: Standard Bathroom Accessibility: Yes How  Accessible: Accessible via walker Home Adaptive Equipment: Grab bars in shower;Grab bars around toilet;Walker - rolling;Straight cane;Other (comment) Prior Function Level of Independence: Independent Able to Take Stairs?: Yes Driving: Yes 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 Eating/Feeding: Simulated;Independent Where Assessed - Eating/Feeding: Chair Grooming: Performed;Modified independent Where Assessed - Grooming: Standing at sink Upper Body Bathing: Simulated;Set up Lower Body Bathing: Simulated;Minimal assistance Where Assessed - Lower Body Bathing: Sit to stand from chair;Unsupported Upper Body Dressing: Simulated;Set up Where Assessed - Upper Body Dressing: Supported;Sitting, chair Lower Body Dressing: Simulated;Minimal assistance Where Assessed - Lower Body Dressing: Supported;Sit to stand from chair Toilet Transfer: Simulated;Supervision/safety Toilet Transfer Method: Ambulating Toileting - Clothing Manipulation: Simulated;Modified independent Toileting - Hygiene: Simulated;Modified independent Tub/Shower Transfer: Engineer, manufacturing Method: Science writer: Counsellor Used: Knee Immobilizer;Long-handled shoe horn;Long-handled sponge;Reacher;Rolling walker;Sock aid Ambulation Related to ADLs: supervision ADL Comments: S/min A. Pt has all AE from previous hip replacement and is familiar with use. Pt plans to get a tub bench fromDME store after D/C. All educatin completted. OT signing of. Vision/Perception  Vision - History Baseline Vision: No visual deficits Cognition Cognition Arousal/Alertness: Awake/alert Overall Cognitive Status: Appears within functional limits for tasks assessed Orientation Level: Oriented X4 Sensation/Coordination Sensation Light Touch: Appears Intact Coordination Gross Motor Movements are Fluid and Coordinated: Yes Fine Motor Movements are Fluid and  Coordinated: Yes Extremity Assessment RUE Assessment RUE Assessment: Within Functional Limits LUE Assessment LUE Assessment: Within Functional Limits Mobility  Bed Mobility Bed Mobility: No Transfers Transfers: Yes Sit to Stand: From chair/3-in-1;supervision Stand to Sit: Other (comment);To chair/3-in-1supervision End of Session OT - End of Session Equipment Utilized During Treatment: Gait belt;Right knee immobilizer Activity  Tolerance: Patient tolerated treatment well Patient left: in chair;with call bell in reach General Behavior During Session: [facility] for tasks performed Cognition: [facility] for tasks performed   [facility] 05/28/2011, 6:40 PM  [facility], OTR/L  [phone] 05/28/2011

## 2011-05-28 NOTE — Progress Notes (Signed)
ANTICOAGULATION CONSULT NOTE - Initial Consult  Pharmacy Consult for coumadin Indication: VTE prophylaxis  No Known Allergies  Patient Measurements: Weight: 229 lb 8 oz (104.1 kg)   Vital Signs: Temp: 97.3 F (36.3 C) (04/17 0600) Temp src: Oral (04/17 0600) BP: 123/77 mmHg (04/17 0600) Pulse Rate: 82  (04/17 0600)  Labs:  Basename 05/28/11 0630  HGB 12.5*  HCT 35.6*  PLT 271  APTT --  LABPROT 14.4  INR 1.10  HEPARINUNFRC --  CREATININE 0.89  CKTOTAL --  CKMB --  TROPONINI --   CrCl is unknown because there is no height on file for the current visit.  Medical History: Past Medical History  Diagnosis Date  . PONV (postoperative nausea and vomiting)   . Blood transfusion 2009    s/p hip replacement  . Arthritis     degenerative joint disease    Medications:  Prescriptions prior to admission  Medication Sig Dispense Refill  . Alfalfa 500 MG TABS Take 1 tablet by mouth 3 (three) times daily.      . celecoxib (CELEBREX) 200 MG capsule Take 200 mg by mouth daily.      . cholecalciferol (VITAMIN D) 1000 UNITS tablet Take 1,000 Units by mouth daily.      Marland Kitchen omega-3 acid ethyl esters (LOVAZA) 1 G capsule Take 1 g by mouth 3 (three) times daily.      Marland Kitchen OVER THE COUNTER MEDICATION Take 1 tablet by mouth 2 (two) times daily. Over The Counter Vinegar and Honey      . vitamin B-12 (CYANOCOBALAMIN) 500 MCG tablet Take 500 mcg by mouth daily.      . vitamin C (ASCORBIC ACID) 500 MG tablet Take 500 mg by mouth daily.        Assessment: 51 yo WM s/p R TKR on coumadin for VTE px. Coumadin score = 8. INR = 1.1 after first dose of 10mg . No bleeding noted. Book given 4/16. Video refused.  Goal of Therapy:  INR 2-3   Plan: 1. Repeat Coumadin 10 mg po x 1 dose today 2. LMWH 30 q12h 3. Reorder coumadin video 4. Daily INR Herby Abraham, Pharm.D. [phone] 05/28/2011 10:36 AM

## 2011-05-28 NOTE — Progress Notes (Signed)
Orthopedic Tech Progress Note Patient Details:  Richard Morris 1960-02-19 [id number]  CPM Right Knee CPM Right Knee: On Right Knee Flexion (Degrees): 50  Right Knee Extension (Degrees): 0    [name], [name] 05/28/2011, 7:05 AM

## 2011-05-29 LAB — PROTIME-INR
INR: 1.69 — ABNORMAL HIGH (ref 0.00–1.49)
Prothrombin Time: 20.2 seconds — ABNORMAL HIGH (ref 11.6–15.2)

## 2011-05-29 LAB — CBC
MCHC: 34 g/dL (ref 30.0–36.0)
Platelets: 256 10*3/uL (ref 150–400)
RDW: 13.8 % (ref 11.5–15.5)
WBC: 11.2 10*3/uL — ABNORMAL HIGH (ref 4.0–10.5)

## 2011-05-29 MED ORDER — METHOCARBAMOL 500 MG PO TABS: 500.0000 mg | ORAL_TABLET | Freq: Four times a day (QID) | ORAL | Status: AC | PRN

## 2011-05-29 MED ORDER — LACTATED RINGERS IV SOLN: INTRAVENOUS | Status: DC

## 2011-05-29 MED ORDER — WARFARIN SODIUM 5 MG PO TABS
5.0000 mg | ORAL_TABLET | Freq: Once | ORAL | Status: AC
  Administered 2011-05-29: 5 mg via ORAL
  Filled 2011-05-29: qty 1

## 2011-05-29 MED ORDER — OXYCODONE-ACETAMINOPHEN 5-325 MG PO TABS: 1.0000 | ORAL_TABLET | ORAL | Status: AC | PRN

## 2011-05-29 MED ORDER — WARFARIN SODIUM 5 MG PO TABS: ORAL_TABLET | ORAL | Status: DC

## 2011-05-29 NOTE — Progress Notes (Signed)
Subjective: 2 Days Post-Op Procedure(s) (LRB): TOTAL KNEE ARTHROPLASTY (Right)  Activity level:  OOB with PT Diet tolerance:  regular Voiding:  well Patient reports pain as mild.    Objective: Vital signs in last 24 hours: Temp:  [98 F (36.7 C)-98.7 F (37.1 C)] 98 F (36.7 C) (04/18 0629) Pulse Rate:  [89-91] 89  (04/18 0629) Resp:  [18-22] 20  (04/18 0629) BP: (106-118)/(61-68) 106/61 mmHg (04/18 0629) SpO2:  [94 %-98 %] 94 % (04/18 0629)  Labs:  Basename 05/29/11 0511 05/28/11 0630  HGB 12.0* 12.5*    Basename 05/29/11 0511 05/28/11 0630  WBC 11.2* 14.7*  RBC 4.13* 4.21*  HCT 35.3* 35.6*  PLT 256 271    Basename 05/28/11 0630  NA 135  K 4.0  CL 106  CO2 27  BUN 12  CREATININE 0.89  GLUCOSE 149*  CALCIUM 8.7    Basename 05/29/11 0511 05/28/11 0630  LABPT -- --  INR 1.69* 1.10    Physical Exam:  ABD soft Neurovascular intact Sensation intact distally Intact pulses distally Incision: no drainage  Assessment/Plan:  2 Days Post-Op Procedure(s) (LRB): TOTAL KNEE ARTHROPLASTY (Right) Up with therapy D/C IV fluids Plan for discharge tomorrow    Richard Morris G 05/29/2011, 12:38 PM

## 2011-05-29 NOTE — Progress Notes (Signed)
ANTICOAGULATION CONSULT NOTE   Pharmacy Consult for coumadin Indication: VTE prophylaxis  No Known Allergies  Patient Measurements: Weight: 229 lb 8 oz (104.1 kg)   Vital Signs: Temp: 98 F (36.7 C) (04/18 0629) BP: 106/61 mmHg (04/18 0629) Pulse Rate: 89  (04/18 0629)  Labs:  Basename 05/29/11 0511 05/28/11 0630  HGB 12.0* 12.5*  HCT 35.3* 35.6*  PLT 256 271  APTT -- --  LABPROT 20.2* 14.4  INR 1.69* 1.10  HEPARINUNFRC -- --  CREATININE -- 0.89  CKTOTAL -- --  CKMB -- --  TROPONINI -- --   Assessment: 51 yo WM s/p R TKR on coumadin for VTE px. Coumadin score = 8. INR = 1.69  after 2 doses of 10mg . Large ~ 6 sec jump in protime.  No bleeding noted. Education completed 05/28/11.  Goal of Therapy:  INR 2-3   Plan:  1. Coumadin 5 mg po x 1 dose today 2. LMWH 30 q12h 3. Daily INR Herby Abraham, Pharm.D. [phone] 05/29/2011 9:10 AM

## 2011-05-29 NOTE — Progress Notes (Signed)
UR COMPLETED  

## 2011-05-29 NOTE — Progress Notes (Signed)
Physical Therapy Treatment Patient Details Name: Richard Morris MRN: [id number] DOB: [birth year] Today's Date: 05/29/2011  PT Assessment/Plan  PT - Assessment/Plan Comments on Treatment Session: Continues to improve. will be safe with HHPT PT Plan: Discharge plan remains appropriate PT Frequency: 7X/week Follow Up Recommendations: Home health PT Equipment Recommended: None recommended by PT PT Goals  Acute Rehab PT Goals PT Goal: Supine/Side to Sit - Progress: Met PT Goal: Sit to Stand - Progress: Met PT Transfer Goal: Bed to Chair/Chair to Bed - Progress: Progressing toward goal PT Goal: Ambulate - Progress: Progressing toward goal PT Goal: Up/Down Stairs - Progress: Progressing toward goal  PT Treatment Precautions/Restrictions  Precautions Precautions: Knee Required Braces or Orthoses: Knee Immobilizer - Right Knee Immobilizer - Right: Discontinue once straight leg raise with < 10 degree lag Restrictions Weight Bearing Restrictions: Yes RLE Weight Bearing: Weight bearing as tolerated Mobility (including Balance) Transfers Sit to Stand: 5: Supervision;From chair/3-in-1;With upper extremity assist Sit to Stand Details (indicate cue type and reason): no cuing needed Stand to Sit: 5: Supervision Ambulation/Gait Ambulation/Gait: Yes Ambulation/Gait Assistance: 5: Supervision Ambulation/Gait Assistance Details (indicate cue type and reason): safe if not a little rushed. Ambulation Distance (Feet): 150 Feet (times 2) Assistive device: Rolling walker Gait Pattern: Step-to pattern Stairs: Yes Stairs Assistance: Other (comment) (min guard A) Stair Management Technique: Two rails;Step to pattern;Forwards;Other (comment) Number of Stairs: 3  Wheelchair Mobility Wheelchair Mobility: No  Posture/Postural Control Posture/Postural Control: No significant limitations Balance Balance Assessed: No Exercise  Total Joint Exercises Ankle Circles/Pumps: AROM;Right;Left;15 reps;Other  reps (comment) Quad Sets: AROM;Right;10 reps;Supine Short Arc Quad: AAROM;Left;10 reps;Supine Heel Slides: AAROM;Right;10 reps;Supine Hip ABduction/ADduction: AAROM;Right;10 reps;Supine Straight Leg Raises: AAROM;Right;10 reps;Supine End of Session PT - End of Session Activity Tolerance: Patient tolerated treatment well;Patient limited by pain Patient left: in bed;with call bell in reach Nurse Communication: Mobility status for transfers;Mobility status for ambulation General Behavior During Session: [facility] for tasks performed Cognition: [facility] for tasks performed  Saiya Crist, Eliseo Gum 05/29/2011, 2:22 PM  05/29/2011  [place] Bing, PT [phone] (pager)

## 2011-05-29 NOTE — Progress Notes (Signed)
05/29/11 1700  PT Visit Information  Last PT Received On 05/29/11  Precautions  Precautions Knee  Required Braces or Orthoses Knee Immobilizer - Right  Knee Immobilizer - Right Discontinue once straight leg raise with < 10 degree lag  Restrictions  RLE Weight Bearing WBAT  Bed Mobility  Bed Mobility Yes  Supine to Sit 6: Modified independent (Device/Increase time)  Sitting - Scoot to Edge of Bed 6: Modified independent (Device/Increase time)  Transfers  Transfers Yes  Sit to Stand 6: Modified independent (Device/Increase time)  Stand to Sit 6: Modified independent (Device/Increase time)  Ambulation/Gait  Ambulation/Gait Assistance 5: Supervision  Ambulation/Gait Assistance Details (indicate cue type and reason) safe, no cues needed  Ambulation Distance (Feet) 60 Feet  Assistive device Rolling walker  Gait Pattern Step-to pattern  Posture/Postural Control  Posture/Postural Control No significant limitations  Total Joint Exercises  Ankle Circles/Pumps AROM;15 reps;Supine  Quad Sets AROM;Both;10 reps;Supine  Heel Slides AAROM;Right;10 reps;Supine  Short Arc Quad AAROM;Right;10 reps;Supine  Hip ABduction/ADduction AAROM;10 reps;Right;Supine  Straight Leg Raises AROM;10 reps;Right;Supine  Knee Flexion AROM;AAROM;Right;10 reps;Seated;Other (comment) (`87 degrees)  PT - End of Session  Activity Tolerance Patient tolerated treatment well  Patient left in chair;with call bell in reach  Nurse Communication Mobility status for transfers;Mobility status for ambulation  General  Behavior During Session [facility] for tasks performed  Cognition [facility] for tasks performed  PT - Assessment/Plan  Comments on Treatment Session ready for D/C.  No equipment needed.  PT Plan Discharge plan remains appropriate  PT Frequency 7X/week  Follow Up Recommendations Home health PT  Equipment Recommended None recommended by PT  Acute Rehab PT Goals  PT Goal: Supine/Side to Sit - Progress Met  PT Goal: Sit  to Stand - Progress Met  PT Transfer Goal: Bed to Chair/Chair to Bed - Progress Met  PT Goal: Ambulate - Progress Progressing toward goal  PT Goal: Up/Down Stairs - Progress Met

## 2011-05-30 LAB — CBC
MCHC: 34 g/dL (ref 30.0–36.0)
Platelets: 267 10*3/uL (ref 150–400)
RDW: 13.4 % (ref 11.5–15.5)
WBC: 10.2 10*3/uL (ref 4.0–10.5)

## 2011-05-30 LAB — PROTIME-INR: INR: 1.79 — ABNORMAL HIGH (ref 0.00–1.49)

## 2011-05-30 NOTE — Progress Notes (Signed)
Physical Therapy Treatment Patient Details Name: Richard Morris MRN: [id number] DOB: April 25, 1960 Today's Date: 05/30/2011 Time: 4098-1191 PT Time Calculation (min): 25 min  PT Assessment / Plan / Recommendation Comments on Treatment Session  ready for D/C.  No equipment needed.    Follow Up Recommendations  Home health PT    Equipment Recommendations  None recommended by PT    Frequency 7X/week   Plan Discharge plan remains appropriate    Precautions / Restrictions Precautions Precautions: Knee Knee Immobilizer - Right: Discontinue once straight leg raise with < 10 degree lag Restrictions RLE Weight Bearing: Weight bearing as tolerated   Pertinent Vitals/Pain     Mobility  Bed Mobility Bed Mobility: Supine to Sit Supine to Sit: 6: Modified independent (Device/Increase time) Sitting - Scoot to Edge of Bed: 6: Modified independent (Device/Increase time) Transfers Transfers: Sit to Stand;Stand to Sit Sit to Stand: 6: Modified independent (Device/Increase time) Stand to Sit: 6: Modified independent (Device/Increase time)    Exercises Total Joint Exercises Ankle Circles/Pumps: AROM;15 reps;Supine Quad Sets: AROM;Both;10 reps;Supine Short Arc Quad: Right;10 reps;Supine;AROM Heel Slides: AAROM;Right;10 reps;Supine Hip ABduction/ADduction: 10 reps;Right;Supine;AROM Straight Leg Raises: AROM;10 reps;Right;Supine Long Arc Quad: AROM;Right;10 reps;Seated Knee Flexion: AROM;AAROM;Right;10 reps;Seated;Other (comment)   PT Goals Acute Rehab PT Goals PT Goal: Supine/Side to Sit - Progress: Met PT Goal: Sit to Stand - Progress: Met PT Transfer Goal: Bed to Chair/Chair to Bed - Progress: Met PT Goal: Ambulate - Progress: Met PT Goal: Up/Down Stairs - Progress: Met  Visit Information  Last PT Received On: 05/30/11    Subjective Data  Patient Stated Goal: Independent, back to work.   Cognition  Overall Cognitive Status: Appears within functional limits for tasks  assessed/performed Arousal/Alertness: Awake/alert Orientation Level: Oriented X4 / Intact Behavior During Session: [employer] for tasks performed    Balance     End of Session PT - End of Session Activity Tolerance: Patient tolerated treatment well Patient left: in bed;with call bell/phone within reach Nurse Communication: Mobility status    Juanetta Negash, Eliseo Gum 05/30/2011, 2:00 PM  05/30/2011  [place] Bing, PT [phone] (pager)

## 2011-05-30 NOTE — Discharge Summary (Signed)
Patient ID: Richard Morris MRN: [id number] DOB/AGE: 1960-04-02 51 y.o.  Admit date: 05/27/2011 Discharge date: 05/30/2011  Admission Diagnoses:  Principal Problem:  *Right knee DJD   Discharge Diagnoses:  Same  Past Medical History  Diagnosis Date  . PONV (postoperative nausea and vomiting)   . Blood transfusion 2009    s/p hip replacement  . Arthritis     degenerative joint disease    Surgeries: Procedure(s): TOTAL KNEE ARTHROPLASTY on 05/27/2011   Consultants:    Discharged Condition: Improved  Hospital Course: TRENDON ZARING is an 51 y.o. male who was admitted 05/27/2011 for operative treatment ofRight knee DJD. Patient has severe unremitting pain that affects sleep, daily activities, and work/hobbies. After pre-op clearance the patient was taken to the operating room on 05/27/2011 and underwent  Procedure(s): TOTAL KNEE ARTHROPLASTY.    Patient was given perioperative antibiotics: Anti-infectives     Start     Dose/Rate Route Frequency Ordered Stop   05/28/11 1000   ceFAZolin (ANCEF) IVPB 2 g/50 mL premix        2 g 100 mL/hr over 30 Minutes Intravenous  Once 05/28/11 0840 05/28/11 0956   05/27/11 1300   ceFAZolin (ANCEF) IVPB 2 g/50 mL premix        2 g 100 mL/hr over 30 Minutes Intravenous Every 6 hours 05/27/11 1211 05/28/11 0149   05/27/11 0825   cefUROXime (ZINACEF) injection  Status:  Discontinued          As needed 05/27/11 0825 05/27/11 0959   05/26/11 1443   ceFAZolin (ANCEF) IVPB 2 g/50 mL premix        2 g 100 mL/hr over 30 Minutes Intravenous 60 min pre-op 05/26/11 1443 05/27/11 0805           Patient was given sequential compression devices, early ambulation, and chemoprophylaxis to prevent DVT.  Patient benefited maximally from hospital stay and there were no complications.    Recent vital signs: Patient Vitals for the past 24 hrs:  BP Temp Temp src Pulse Resp SpO2  05/30/11 0618 125/78 mmHg 98.8 F (37.1 C) - 80  18  99 %  05-31-2011 2200  136/87 mmHg 97.8 F (36.6 C) Oral 91  17  96 %  2011-05-31 1400 111/70 mmHg 98 F (36.7 C) - 91  16  93 %     Recent laboratory studies:  Basename 05/30/11 0529 2011/05/31 0511 05/28/11 0630  WBC 10.2 11.2* --  HGB 11.7* 12.0* --  HCT 34.4* 35.3* --  PLT 267 256 --  NA -- -- 135  K -- -- 4.0  CL -- -- 106  CO2 -- -- 27  BUN -- -- 12  CREATININE -- -- 0.89  GLUCOSE -- -- 149*  INR 1.79* 1.69* --  CALCIUM -- -- 8.7     Discharge Medications:   Medication List  As of 05/30/2011  8:04 AM   STOP taking these medications         Alfalfa 500 MG Tabs      celecoxib 200 MG capsule      omega-3 acid ethyl esters 1 G capsule      OVER THE COUNTER MEDICATION         TAKE these medications         cholecalciferol 1000 UNITS tablet   Commonly known as: VITAMIN D   Take 1,000 Units by mouth daily.      methocarbamol 500 MG tablet   Commonly known as: ROBAXIN  Take 1 tablet (500 mg total) by mouth every 6 (six) hours as needed.      oxyCODONE-acetaminophen 5-325 MG per tablet   Commonly known as: PERCOCET   Take 1-2 tablets by mouth every 4 (four) hours as needed.      vitamin B-12 500 MCG tablet   Commonly known as: CYANOCOBALAMIN   Take 500 mcg by mouth daily.      vitamin C 500 MG tablet   Commonly known as: ASCORBIC ACID   Take 500 mg by mouth daily.      warfarin 5 MG tablet   Commonly known as: COUMADIN   Take as directed per home health pharmacist. X 2 weeks   INR 2.0-3.0            Diagnostic Studies: Dg Chest 2 View  05/20/2011  *RADIOLOGY REPORT*  Clinical Data: Preop for right knee replacement, former smoking history  CHEST - 2 VIEW  Comparison: Chest x-ray of 05/31/2007  Findings: The lungs are clear.  Mediastinal contours appear normal. The heart is within upper limits of normal.  There are degenerative changes throughout the thoracic spine.  IMPRESSION: No active lung disease.  Original Report Authenticated By: Juline Patch, M.D.    Disposition:    Discharge Orders    Future Orders Please Complete By Expires   Diet - low sodium heart healthy      Call MD / Call 911      Comments:   If you experience chest pain or shortness of breath, CALL 911 and be transported to the hospital emergency room.  If you develope a fever above 101 F, pus (white drainage) or increased drainage or redness at the wound, or calf pain, call your surgeon's office.   Constipation Prevention      Comments:   Drink plenty of fluids.  Prune juice may be helpful.  You may use a stool softener, such as Colace (over the counter) 100 mg twice a day.  Use MiraLax (over the counter) for constipation as needed.   Increase activity slowly as tolerated      Weight Bearing as taught in Physical Therapy      Comments:   Use a walker or crutches as instructed.    Will need home therapy for 4 weeks and coumadin protocol for 2 weeks per [facility]  Follow-up Information    Follow up with Velna Ochs, MD in 12 days.   Contact information:   [address] [place] [address] [phone]           Signed: Prince Rome 05/30/2011, 8:04 AM

## 2011-05-30 NOTE — Discharge Instructions (Signed)
May change dressing as needed Home health for therapy and coumadin management . Return to office in 2 weeks   Home health to be provided by [facility] [phone]

## 2011-05-30 NOTE — Progress Notes (Signed)
Subjective: 3 Days Post-Op Procedure(s) (LRB): TOTAL KNEE ARTHROPLASTY (Right)  Activity level:  walking Diet tolerance:  eating Voiding:  ok Patient reports pain as 1 on 0-10 scale.    Objective: Vital signs in last 24 hours: Temp:  [97.8 F (36.6 C)-98.8 F (37.1 C)] 98.8 F (37.1 C) (04/19 0618) Pulse Rate:  [80-91] 80  (04/19 0618) Resp:  [16-18] 18  (04/19 0618) BP: (111-136)/(70-87) 125/78 mmHg (04/19 0618) SpO2:  [93 %-99 %] 99 % (04/19 0618)  Labs:  Basename 05/30/11 0529 05/29/11 0511 05/28/11 0630  HGB 11.7* 12.0* 12.5*    Basename 05/30/11 0529 05/29/11 0511  WBC 10.2 11.2*  RBC 4.12* 4.13*  HCT 34.4* 35.3*  PLT 267 256    Basename 05/28/11 0630  NA 135  K 4.0  CL 106  CO2 27  BUN 12  CREATININE 0.89  GLUCOSE 149*  CALCIUM 8.7    Basename 05/30/11 0529 05/29/11 0511  LABPT -- --  INR 1.79* 1.69*    Physical Exam:  Neurologically intact ABD soft Neurovascular intact Sensation intact distally Intact pulses distally Dorsiflexion/Plantar flexion intact No cellulitis present Compartment soft  Assessment/Plan:  3 Days Post-Op Procedure(s) (LRB): TOTAL KNEE ARTHROPLASTY (Right) Up with therapy D/C IV fluids Discharge home with home health Needs home pt x 4 weeks and coumadin protocol x 2 weeks    Jalon Squier R 05/30/2011, 8:01 AM

## 2011-05-30 NOTE — Progress Notes (Signed)
CARE MANAGEMENT NOTE 05/30/2011  Patient:  Richard Morris, Richard Morris   Account Number:  [id number]  Date Initiated:  05/30/2011  Documentation initiated by:  Vance Peper  Subjective/Objective Assessment:   51 yr old male s/p right total knee arthroplasty.     Action/Plan:   Spoke with patient and wife regarding home health and DME needs. Choice offered. Patient states he was preoperatively setup with [facility], no changes. Has rolling walker, needs CPM and 3in1.   Anticipated DC Date:  05/30/2011   Anticipated DC Plan:  HOME W HOME HEALTH SERVICES      DC Planning Services  CM consult      [facility] Choice  HOME HEALTH  DURABLE MEDICAL EQUIPMENT   Choice offered to / List presented to:  C-1 Patient   DME arranged  CPM  3-N-1      DME agency  [employer]     HH arranged  HH-1 RN  HH-2 PT      HH agency  [facility]   Status of service:  Completed, signed off

## 2013-08-01 ENCOUNTER — Other Ambulatory Visit: Payer: Self-pay | Admitting: Orthopaedic Surgery

## 2013-08-02 ENCOUNTER — Telehealth: Payer: Self-pay | Admitting: Genetic Counselor

## 2013-08-02 NOTE — Telephone Encounter (Signed)
LEFT MESSAGE FOR PATIENT TO RETURN CALL TO SCHEDULE GENETIC APPT.  °

## 2013-08-04 ENCOUNTER — Encounter (HOSPITAL_COMMUNITY): Payer: Self-pay | Admitting: Pharmacy Technician

## 2013-08-05 ENCOUNTER — Other Ambulatory Visit: Payer: Self-pay | Admitting: Orthopaedic Surgery

## 2013-08-06 NOTE — Pre-Procedure Instructions (Signed)
Meade Mawracy L Hallberg  08/06/2013   Your procedure is scheduled on:  July 7  [facility] Admitting at 11:15 AM.  Call this number if you have problems the morning of surgery: [phone]   Remember:   Do not eat food or drink liquids after midnight.   Take these medicines the morning of surgery with A SIP OF WATER: None   Continue to hold all of your current medications/ supplements    STOP/ Do not take Aspirin, Aleve, Naproxen, Advil, Ibuprofen, Motrin, Vitamins, Herbs, or Supplements starting today   Do not wear jewelry, make-up or nail polish.  Do not wear lotions, powders, or perfumes. You may wear deodorant.  Do not shave 48 hours prior to surgery. Men may shave face and neck.  Do not bring valuables to the hospital.  [facility] is not responsible for any belongings or valuables.               Contacts, dentures or bridgework may not be worn into surgery.  Leave suitcase in the car. After surgery it may be brought to your room.  For patients admitted to the hospital, discharge time is determined by your treatment team.               Special Instructions: See [facility] Preparing For Surgery   Please read over the following fact sheets that you were given: Pain Booklet, Coughing and Deep Breathing, Blood Transfusion Information, Total Joint Packet and Surgical Site Infection Prevention

## 2013-08-08 ENCOUNTER — Encounter (HOSPITAL_COMMUNITY)
Admission: RE | Admit: 2013-08-08 | Discharge: 2013-08-08 | Disposition: A | Discharge status: Home / Self Care | Payer: Managed Care, Other (non HMO) | Source: Ambulatory Visit | Attending: Orthopaedic Surgery | Admitting: Orthopaedic Surgery

## 2013-08-08 ENCOUNTER — Encounter (HOSPITAL_COMMUNITY): Payer: Self-pay

## 2013-08-08 DIAGNOSIS — Z01812 Encounter for preprocedural laboratory examination: Secondary | ICD-10-CM | POA: Insufficient documentation

## 2013-08-08 HISTORY — DX: Type 2 diabetes mellitus without complications: E11.9

## 2013-08-08 LAB — CBC
HCT: 40.2 % (ref 39.0–52.0)
HEMOGLOBIN: 13.6 g/dL (ref 13.0–17.0)
MCH: 29 pg (ref 26.0–34.0)
MCHC: 33.8 g/dL (ref 30.0–36.0)
MCV: 85.7 fL (ref 78.0–100.0)
PLATELETS: 241 10*3/uL (ref 150–400)
RBC: 4.69 MIL/uL (ref 4.22–5.81)
RDW: 13.4 % (ref 11.5–15.5)
WBC: 7.7 10*3/uL (ref 4.0–10.5)

## 2013-08-08 LAB — BASIC METABOLIC PANEL
BUN: 17 mg/dL (ref 6–23)
CO2: 27 mEq/L (ref 19–32)
Calcium: 9.2 mg/dL (ref 8.4–10.5)
Chloride: 101 mEq/L (ref 96–112)
Creatinine, Ser: 1.11 mg/dL (ref 0.50–1.35)
GFR calc Af Amer: 86 mL/min — ABNORMAL LOW (ref >90)
GFR calc non Af Amer: 74 mL/min — ABNORMAL LOW (ref >90)
Glucose, Bld: 103 mg/dL — ABNORMAL HIGH (ref 70–99)
Potassium: 4.1 mEq/L (ref 3.7–5.3)
Sodium: 138 mEq/L (ref 137–147)

## 2013-08-08 LAB — SURGICAL PCR SCREEN
MRSA, PCR: NEGATIVE
STAPHYLOCOCCUS AUREUS: POSITIVE — AB

## 2013-08-08 LAB — TYPE AND SCREEN
ABO/RH(D): O POS
Antibody Screen: NEGATIVE

## 2013-08-08 NOTE — Progress Notes (Signed)
[facility] Nolon Nationsalldorf's office, requested that MD sign preop orders

## 2013-08-08 NOTE — Progress Notes (Signed)
Pt. Reports that he has had previous EKG, never referred to cardiologist, denies chest pain, SOB, difficulty breathing.  Seen by PCP today for blood sugar, states it was 119 at home today.

## 2013-08-08 NOTE — Progress Notes (Signed)
08/08/13 1327  OBSTRUCTIVE SLEEP APNEA  Have you ever been diagnosed with sleep apnea through a sleep study? No  Do you snore loudly (loud enough to be heard through closed doors)?  1  Do you often feel tired, fatigued, or sleepy during the daytime? 1 (sometimes tired during the day )  Has anyone observed you stop breathing during your sleep? 1  Do you have, or are you being treated for high blood pressure? 0  BMI more than 35 kg/m2? 0  Age over 54 years old? 1  Neck circumference greater than 40 cm/16 inches? 1  Gender: 1  Obstructive Sleep Apnea Score 6

## 2013-08-08 NOTE — Progress Notes (Signed)
Mupirocin Ointment rx called into [facility] in [place] for positive PCR of staph.

## 2013-08-15 MED ORDER — CEFAZOLIN SODIUM-DEXTROSE 2-3 GM-% IV SOLR
2.0000 g | INTRAVENOUS | Status: AC
  Administered 2013-08-16: 2 g via INTRAVENOUS
  Filled 2013-08-15: qty 50

## 2013-08-15 NOTE — H&P (Signed)
TOTAL KNEE ADMISSION H&P  Patient is being admitted for left total knee arthroplasty.  Subjective:  Chief Complaint:left knee pain.  HPI: Richard Morris, 53 y.o. male, has a history of pain and functional disability in the left knee due to arthritis and has failed non-surgical conservative treatments for greater than 12 weeks to includeNSAID's and/or analgesics, corticosteriod injections, flexibility and strengthening excercises, weight reduction as appropriate and activity modification.  Onset of symptoms was gradual, starting 5 years ago with gradually worsening course since that time. The patient noted no past surgery on the left knee(s).  Patient currently rates pain in the left knee(s) at 10 out of 10 with activity. Patient has night pain, worsening of pain with activity and weight bearing, pain that interferes with activities of daily living, crepitus and joint swelling.  Patient has evidence of subchondral cysts, subchondral sclerosis, periarticular osteophytes and joint space narrowing by imaging studies. There is no active infection.  Patient Active Problem List   Diagnosis Date Noted  . Right knee DJD 05/27/2011    Class: Chronic   Past Medical History  Diagnosis Date  . PONV (postoperative nausea and vomiting)   . Blood transfusion 2009    s/p hip replacement  . Arthritis     degenerative joint disease  . Diabetes mellitus without complication     dropped 17 lbs. , no meds. yet     Past Surgical History  Procedure Laterality Date  . Joint replacement  2009    left hip  . Back surgery  2005    spinal cyst  . Tympanic membrane repair  2001  . Vasectomy  1982  . Total knee arthroplasty  05/27/2011    Procedure: TOTAL KNEE ARTHROPLASTY;  Surgeon: Velna OchsPeter G Dalldorf, MD;  Location: [facility] OR;  Service: Orthopedics;  Laterality: Right;  DEPUY    No prescriptions prior to admission   Allergies  Allergen Reactions  . Celebrex [Celecoxib] Rash    History  Substance Use Topics   . Smoking status: Former Smoker -- 1.00 packs/day for 30 years    Types: Cigarettes    Quit date: 08/08/2005  . Smokeless tobacco: Not on file     Comment: quit smoking 2007  . Alcohol Use: 1.2 oz/week    2 Shots of liquor per week     Comment: 1-2 beers couple times per month    Family History  Problem Relation Age of Onset  . Anesthesia problems Neg Hx      Review of Systems  Musculoskeletal: Positive for joint pain.       Left knee    Objective:  Physical Exam  Constitutional: He is oriented to person, place, and time. He appears well-developed and well-nourished.  HENT:  Head: Normocephalic and atraumatic.  Eyes: Conjunctivae and EOM are normal. Pupils are equal, round, and reactive to light.  Neck: Normal range of motion.  Cardiovascular: Normal rate and regular rhythm.   Respiratory: Effort normal.  GI: Soft.  Musculoskeletal:  Left knee motion is about 0-120.  There is pain on the medial joint line and some crepitation.  There is no effusion.  Opposite knee has a healed midline incision with motion of about 0-105 and no effusion.   Hip motion is full and pain free and SLR is negative on both sides.  There is no palpable LAD behind either knee.  Sensation and motor function are intact on both sides and there are palpable pulses on both sides.   Neurological: He is  alert and oriented to person, place, and time.  Skin: Skin is warm and dry.  Psychiatric: He has a normal mood and affect. His behavior is normal. Judgment and thought content normal.    Vital signs in last 24 hours:    Labs:   Estimated body mass index is 34.90 kg/(m^2) as calculated from the following:   Height as of 05/20/11: 5\' 8"  (1.727 m).   Weight as of 05/20/11: 104.101 kg (229 lb 8 oz).   Imaging Review Plain radiographs demonstrate severe degenerative joint disease of the left knee(s). The overall alignment isneutral. The bone quality appears to be good for age and reported activity  level.  Assessment/Plan:  End stage arthritis, left knee   The patient history, physical examination, clinical judgment of the provider and imaging studies are consistent with end stage degenerative joint disease of the left knee(s) and total knee arthroplasty is deemed medically necessary. The treatment options including medical management, injection therapy arthroscopy and arthroplasty were discussed at length. The risks and benefits of total knee arthroplasty were presented and reviewed. The risks due to aseptic loosening, infection, stiffness, patella tracking problems, thromboembolic complications and other imponderables were discussed. The patient acknowledged the explanation, agreed to proceed with the plan and consent was signed. Patient is being admitted for inpatient treatment for surgery, pain control, PT, OT, prophylactic antibiotics, VTE prophylaxis, progressive ambulation and ADL's and discharge planning. The patient is planning to be discharged home with home health services

## 2013-08-15 NOTE — Progress Notes (Signed)
Instructed patient to arrive at 930 am 08-16-13.

## 2013-08-16 ENCOUNTER — Inpatient Hospital Stay (HOSPITAL_COMMUNITY): Payer: Managed Care, Other (non HMO) | Admitting: Anesthesiology

## 2013-08-16 ENCOUNTER — Ambulatory Visit: Payer: Managed Care, Other (non HMO)

## 2013-08-16 ENCOUNTER — Inpatient Hospital Stay (HOSPITAL_COMMUNITY)
Admission: RE | Admit: 2013-08-16 | Discharge: 2013-08-18 | DRG: 470 | Disposition: A | Discharge status: Home Health | Payer: Managed Care, Other (non HMO) | Source: Ambulatory Visit | Attending: Orthopaedic Surgery | Admitting: Orthopaedic Surgery

## 2013-08-16 ENCOUNTER — Encounter (HOSPITAL_COMMUNITY)
Admission: RE | Disposition: A | Discharge status: Home Health | Payer: Self-pay | Source: Ambulatory Visit | Attending: Orthopaedic Surgery

## 2013-08-16 ENCOUNTER — Encounter (HOSPITAL_COMMUNITY): Payer: Managed Care, Other (non HMO) | Admitting: Anesthesiology

## 2013-08-16 ENCOUNTER — Inpatient Hospital Stay (HOSPITAL_COMMUNITY): Payer: Managed Care, Other (non HMO)

## 2013-08-16 ENCOUNTER — Encounter (HOSPITAL_COMMUNITY): Payer: Self-pay | Admitting: Anesthesiology

## 2013-08-16 DIAGNOSIS — M1712 Unilateral primary osteoarthritis, left knee: Secondary | ICD-10-CM | POA: Diagnosis present

## 2013-08-16 DIAGNOSIS — Z87891 Personal history of nicotine dependence: Secondary | ICD-10-CM

## 2013-08-16 DIAGNOSIS — M1710 Unilateral primary osteoarthritis, unspecified knee: Principal | ICD-10-CM | POA: Diagnosis present

## 2013-08-16 DIAGNOSIS — Z96649 Presence of unspecified artificial hip joint: Secondary | ICD-10-CM

## 2013-08-16 DIAGNOSIS — E119 Type 2 diabetes mellitus without complications: Secondary | ICD-10-CM | POA: Diagnosis present

## 2013-08-16 HISTORY — PX: TOTAL KNEE ARTHROPLASTY: SHX125

## 2013-08-16 LAB — CBC WITH DIFFERENTIAL/PLATELET
BASOS PCT: 3 % — AB (ref 0–1)
Basophils Absolute: 0.3 10*3/uL — ABNORMAL HIGH (ref 0.0–0.1)
EOS ABS: 0.4 10*3/uL (ref 0.0–0.7)
Eosinophils Relative: 4 % (ref 0–5)
HCT: 41.7 % (ref 39.0–52.0)
HEMOGLOBIN: 14.1 g/dL (ref 13.0–17.0)
Lymphocytes Relative: 35 % (ref 12–46)
Lymphs Abs: 3.2 10*3/uL (ref 0.7–4.0)
MCH: 29.3 pg (ref 26.0–34.0)
MCHC: 33.8 g/dL (ref 30.0–36.0)
MCV: 86.5 fL (ref 78.0–100.0)
MONO ABS: 1 10*3/uL (ref 0.1–1.0)
Monocytes Relative: 11 % (ref 3–12)
Neutro Abs: 4.1 10*3/uL (ref 1.7–7.7)
Neutrophils Relative %: 47 % (ref 43–77)
PLATELETS: 229 10*3/uL (ref 150–400)
RBC: 4.82 MIL/uL (ref 4.22–5.81)
RDW: 13.4 % (ref 11.5–15.5)
WBC: 9 10*3/uL (ref 4.0–10.5)

## 2013-08-16 LAB — PROTIME-INR
INR: 0.97 (ref 0.00–1.49)
Prothrombin Time: 12.9 seconds (ref 11.6–15.2)

## 2013-08-16 LAB — GLUCOSE, CAPILLARY
GLUCOSE-CAPILLARY: 126 mg/dL — AB (ref 70–99)
GLUCOSE-CAPILLARY: 162 mg/dL — AB (ref 70–99)
GLUCOSE-CAPILLARY: 148 mg/dL — AB (ref 70–99)
Glucose-Capillary: 114 mg/dL — ABNORMAL HIGH (ref 70–99)

## 2013-08-16 LAB — APTT: APTT: 30 s (ref 24–37)

## 2013-08-16 SURGERY — ARTHROPLASTY, KNEE, TOTAL
Anesthesia: General | Site: Knee | Laterality: Left

## 2013-08-16 MED ORDER — MENTHOL 3 MG MT LOZG: 1.0000 | LOZENGE | OROMUCOSAL | Status: DC | PRN

## 2013-08-16 MED ORDER — OXYCODONE HCL 5 MG PO TABS
5.0000 mg | ORAL_TABLET | Freq: Once | ORAL | Status: AC | PRN
  Administered 2013-08-16: 5 mg via ORAL

## 2013-08-16 MED ORDER — OXYCODONE HCL 5 MG/5ML PO SOLN: 5.0000 mg | Freq: Once | ORAL | Status: AC | PRN

## 2013-08-16 MED ORDER — HYDROMORPHONE HCL PF 1 MG/ML IJ SOLN
INTRAMUSCULAR | Status: AC
  Filled 2013-08-16: qty 1

## 2013-08-16 MED ORDER — FENTANYL CITRATE 0.05 MG/ML IJ SOLN
INTRAMUSCULAR | Status: AC
  Administered 2013-08-16: 50 ug
  Filled 2013-08-16: qty 2

## 2013-08-16 MED ORDER — SUCCINYLCHOLINE CHLORIDE 20 MG/ML IJ SOLN
INTRAMUSCULAR | Status: DC | PRN
  Administered 2013-08-16: 120 mg via INTRAVENOUS

## 2013-08-16 MED ORDER — ONDANSETRON HCL 4 MG/2ML IJ SOLN
INTRAMUSCULAR | Status: AC
  Filled 2013-08-16: qty 2

## 2013-08-16 MED ORDER — CHLORHEXIDINE GLUCONATE 4 % EX LIQD
60.0000 mL | Freq: Once | CUTANEOUS | Status: DC
  Filled 2013-08-16: qty 60

## 2013-08-16 MED ORDER — LIDOCAINE HCL (CARDIAC) 20 MG/ML IV SOLN
INTRAVENOUS | Status: DC | PRN
  Administered 2013-08-16: 70 mg via INTRAVENOUS

## 2013-08-16 MED ORDER — ASPIRIN EC 325 MG PO TBEC
325.0000 mg | DELAYED_RELEASE_TABLET | Freq: Two times a day (BID) | ORAL | Status: DC
  Administered 2013-08-17 – 2013-08-18 (×3): 325 mg via ORAL
  Filled 2013-08-16 (×5): qty 1

## 2013-08-16 MED ORDER — CEFAZOLIN SODIUM-DEXTROSE 2-3 GM-% IV SOLR
2.0000 g | Freq: Four times a day (QID) | INTRAVENOUS | Status: AC
  Administered 2013-08-16 – 2013-08-17 (×2): 2 g via INTRAVENOUS
  Filled 2013-08-16 (×2): qty 50

## 2013-08-16 MED ORDER — ONDANSETRON HCL 4 MG/2ML IJ SOLN
INTRAMUSCULAR | Status: DC | PRN
  Administered 2013-08-16: 4 mg via INTRAVENOUS

## 2013-08-16 MED ORDER — FENTANYL CITRATE 0.05 MG/ML IJ SOLN
INTRAMUSCULAR | Status: DC | PRN
  Administered 2013-08-16: 50 ug via INTRAVENOUS
  Administered 2013-08-16: 100 ug via INTRAVENOUS
  Administered 2013-08-16 (×2): 50 ug via INTRAVENOUS

## 2013-08-16 MED ORDER — OXYCODONE HCL 5 MG PO TABS
ORAL_TABLET | ORAL | Status: AC
  Filled 2013-08-16: qty 1

## 2013-08-16 MED ORDER — METOCLOPRAMIDE HCL 5 MG/ML IJ SOLN
INTRAMUSCULAR | Status: DC | PRN
  Administered 2013-08-16: 10 mg via INTRAVENOUS

## 2013-08-16 MED ORDER — ONDANSETRON HCL 4 MG/2ML IJ SOLN
4.0000 mg | Freq: Four times a day (QID) | INTRAMUSCULAR | Status: DC | PRN
  Administered 2013-08-16: 4 mg via INTRAVENOUS
  Filled 2013-08-16: qty 2

## 2013-08-16 MED ORDER — SODIUM CHLORIDE 0.9 % IR SOLN
Status: DC | PRN
  Administered 2013-08-16 (×2): 1000 mL

## 2013-08-16 MED ORDER — BUPIVACAINE LIPOSOME 1.3 % IJ SUSP
20.0000 mL | Freq: Once | INTRAMUSCULAR | Status: DC
  Filled 2013-08-16: qty 20

## 2013-08-16 MED ORDER — BUPIVACAINE LIPOSOME 1.3 % IJ SUSP
INTRAMUSCULAR | Status: DC | PRN
  Administered 2013-08-16: 20 mL

## 2013-08-16 MED ORDER — PHENOL 1.4 % MT LIQD: 1.0000 | OROMUCOSAL | Status: DC | PRN

## 2013-08-16 MED ORDER — DOCUSATE SODIUM 100 MG PO CAPS
100.0000 mg | ORAL_CAPSULE | Freq: Two times a day (BID) | ORAL | Status: DC
  Administered 2013-08-16 – 2013-08-18 (×4): 100 mg via ORAL
  Filled 2013-08-16 (×4): qty 1

## 2013-08-16 MED ORDER — LACTATED RINGERS IV SOLN: INTRAVENOUS | Status: DC

## 2013-08-16 MED ORDER — ACETAMINOPHEN 325 MG PO TABS: 650.0000 mg | ORAL_TABLET | Freq: Four times a day (QID) | ORAL | Status: DC | PRN

## 2013-08-16 MED ORDER — GLYCOPYRROLATE 0.2 MG/ML IJ SOLN
INTRAMUSCULAR | Status: DC | PRN
  Administered 2013-08-16: .4 mg via INTRAVENOUS

## 2013-08-16 MED ORDER — ROCURONIUM BROMIDE 100 MG/10ML IV SOLN
INTRAVENOUS | Status: DC | PRN
  Administered 2013-08-16: 50 mg via INTRAVENOUS

## 2013-08-16 MED ORDER — MIDAZOLAM HCL 5 MG/5ML IJ SOLN
INTRAMUSCULAR | Status: DC | PRN
  Administered 2013-08-16: 2 mg via INTRAVENOUS

## 2013-08-16 MED ORDER — MIDAZOLAM HCL 2 MG/2ML IJ SOLN
INTRAMUSCULAR | Status: AC
  Filled 2013-08-16: qty 2

## 2013-08-16 MED ORDER — METOCLOPRAMIDE HCL 5 MG/ML IJ SOLN: 5.0000 mg | Freq: Three times a day (TID) | INTRAMUSCULAR | Status: DC | PRN

## 2013-08-16 MED ORDER — DEXAMETHASONE SODIUM PHOSPHATE 4 MG/ML IJ SOLN
INTRAMUSCULAR | Status: DC | PRN
  Administered 2013-08-16: 4 mg via INTRAVENOUS

## 2013-08-16 MED ORDER — DIPHENHYDRAMINE HCL 12.5 MG/5ML PO ELIX: 12.5000 mg | ORAL_SOLUTION | ORAL | Status: DC | PRN

## 2013-08-16 MED ORDER — TRANEXAMIC ACID 100 MG/ML IV SOLN
1000.0000 mg | INTRAVENOUS | Status: AC
  Administered 2013-08-16: 1000 mg via INTRAVENOUS
  Filled 2013-08-16: qty 10

## 2013-08-16 MED ORDER — ACETAMINOPHEN 650 MG RE SUPP: 650.0000 mg | Freq: Four times a day (QID) | RECTAL | Status: DC | PRN

## 2013-08-16 MED ORDER — FENTANYL CITRATE 0.05 MG/ML IJ SOLN
INTRAMUSCULAR | Status: AC
  Filled 2013-08-16: qty 5

## 2013-08-16 MED ORDER — METHOCARBAMOL 500 MG PO TABS
500.0000 mg | ORAL_TABLET | Freq: Four times a day (QID) | ORAL | Status: DC | PRN
  Administered 2013-08-16 – 2013-08-17 (×4): 500 mg via ORAL
  Filled 2013-08-16 (×4): qty 1

## 2013-08-16 MED ORDER — METHOCARBAMOL 1000 MG/10ML IJ SOLN
500.0000 mg | Freq: Four times a day (QID) | INTRAVENOUS | Status: DC | PRN
  Filled 2013-08-16: qty 5

## 2013-08-16 MED ORDER — LACTATED RINGERS IV SOLN
INTRAVENOUS | Status: DC
  Administered 2013-08-16 (×2): via INTRAVENOUS

## 2013-08-16 MED ORDER — LACTATED RINGERS IV SOLN
INTRAVENOUS | Status: DC
  Administered 2013-08-16: 10:00:00 via INTRAVENOUS

## 2013-08-16 MED ORDER — FERROUS SULFATE 325 (65 FE) MG PO TABS
325.0000 mg | ORAL_TABLET | Freq: Two times a day (BID) | ORAL | Status: DC
  Administered 2013-08-16 – 2013-08-18 (×2): 325 mg via ORAL
  Filled 2013-08-16 (×6): qty 1

## 2013-08-16 MED ORDER — ARTIFICIAL TEARS OP OINT
TOPICAL_OINTMENT | OPHTHALMIC | Status: AC
  Filled 2013-08-16: qty 3.5

## 2013-08-16 MED ORDER — METOCLOPRAMIDE HCL 5 MG/ML IJ SOLN
INTRAMUSCULAR | Status: AC
  Filled 2013-08-16: qty 2

## 2013-08-16 MED ORDER — ALUM & MAG HYDROXIDE-SIMETH 200-200-20 MG/5ML PO SUSP: 30.0000 mL | ORAL | Status: DC | PRN

## 2013-08-16 MED ORDER — NEOSTIGMINE METHYLSULFATE 10 MG/10ML IV SOLN
INTRAVENOUS | Status: DC | PRN
  Administered 2013-08-16: 3 mg via INTRAVENOUS

## 2013-08-16 MED ORDER — DEXAMETHASONE SODIUM PHOSPHATE 4 MG/ML IJ SOLN
INTRAMUSCULAR | Status: AC
  Filled 2013-08-16: qty 1

## 2013-08-16 MED ORDER — HYDROMORPHONE HCL PF 1 MG/ML IJ SOLN
0.2500 mg | INTRAMUSCULAR | Status: DC | PRN
  Administered 2013-08-16 (×4): 0.5 mg via INTRAVENOUS

## 2013-08-16 MED ORDER — BISACODYL 5 MG PO TBEC
5.0000 mg | DELAYED_RELEASE_TABLET | Freq: Every day | ORAL | Status: DC | PRN
  Administered 2013-08-18: 5 mg via ORAL
  Filled 2013-08-16: qty 1

## 2013-08-16 MED ORDER — METOCLOPRAMIDE HCL 5 MG/ML IJ SOLN: 10.0000 mg | Freq: Once | INTRAMUSCULAR | Status: DC | PRN

## 2013-08-16 MED ORDER — MIDAZOLAM HCL 2 MG/2ML IJ SOLN
INTRAMUSCULAR | Status: AC
  Administered 2013-08-16: 2 mg
  Filled 2013-08-16: qty 2

## 2013-08-16 MED ORDER — HYDROCODONE-ACETAMINOPHEN 5-325 MG PO TABS
1.0000 | ORAL_TABLET | ORAL | Status: DC | PRN
  Administered 2013-08-16 – 2013-08-18 (×9): 2 via ORAL
  Filled 2013-08-16 (×9): qty 2

## 2013-08-16 MED ORDER — PROPOFOL 10 MG/ML IV BOLUS
INTRAVENOUS | Status: DC | PRN
  Administered 2013-08-16: 200 mg via INTRAVENOUS

## 2013-08-16 MED ORDER — METHOCARBAMOL 500 MG PO TABS
ORAL_TABLET | ORAL | Status: AC
  Filled 2013-08-16: qty 1

## 2013-08-16 MED ORDER — METOCLOPRAMIDE HCL 5 MG PO TABS
5.0000 mg | ORAL_TABLET | Freq: Three times a day (TID) | ORAL | Status: DC | PRN
  Filled 2013-08-16: qty 2

## 2013-08-16 MED ORDER — 0.9 % SODIUM CHLORIDE (POUR BTL) OPTIME
TOPICAL | Status: DC | PRN
  Administered 2013-08-16: 1000 mL

## 2013-08-16 MED ORDER — BUPIVACAINE HCL (PF) 0.5 % IJ SOLN
INTRAMUSCULAR | Status: DC | PRN
  Administered 2013-08-16: 25 mL via PERINEURAL

## 2013-08-16 MED ORDER — HYDROMORPHONE HCL PF 1 MG/ML IJ SOLN
0.5000 mg | INTRAMUSCULAR | Status: DC | PRN
  Administered 2013-08-16: 1 mg via INTRAVENOUS
  Filled 2013-08-16: qty 1

## 2013-08-16 MED ORDER — ONDANSETRON HCL 4 MG PO TABS: 4.0000 mg | ORAL_TABLET | Freq: Four times a day (QID) | ORAL | Status: DC | PRN

## 2013-08-16 SURGICAL SUPPLY — 59 items
BANDAGE ELASTIC 4 VELCRO ST LF (GAUZE/BANDAGES/DRESSINGS) IMPLANT
BANDAGE ESMARK 6X9 LF (GAUZE/BANDAGES/DRESSINGS) ×1 IMPLANT
BANDAGE GAUZE ELAST BULKY 4 IN (GAUZE/BANDAGES/DRESSINGS) ×2 IMPLANT
BENZOIN TINCTURE PRP APPL 2/3 (GAUZE/BANDAGES/DRESSINGS) ×2 IMPLANT
BLADE SAGITTAL 25.0X1.19X90 (BLADE) ×2 IMPLANT
BLADE SURG ROTATE 9660 (MISCELLANEOUS) IMPLANT
BNDG ELASTIC 6X10 VLCR STRL LF (GAUZE/BANDAGES/DRESSINGS) ×2 IMPLANT
BNDG ESMARK 6X9 LF (GAUZE/BANDAGES/DRESSINGS) ×2
BOWL SMART MIX CTS (DISPOSABLE) ×2 IMPLANT
CAPT RP KNEE ×2 IMPLANT
CEMENT HV SMART SET (Cement) ×4 IMPLANT
COVER SURGICAL LIGHT HANDLE (MISCELLANEOUS) ×2 IMPLANT
CUFF TOURNIQUET SINGLE 34IN LL (TOURNIQUET CUFF) ×2 IMPLANT
CUFF TOURNIQUET SINGLE 44IN (TOURNIQUET CUFF) IMPLANT
DRAPE EXTREMITY T 121X128X90 (DRAPE) ×2 IMPLANT
DRAPE PROXIMA HALF (DRAPES) ×2 IMPLANT
DRAPE U-SHAPE 47X51 STRL (DRAPES) ×2 IMPLANT
DRSG ADAPTIC 3X8 NADH LF (GAUZE/BANDAGES/DRESSINGS) ×2 IMPLANT
DRSG PAD ABDOMINAL 8X10 ST (GAUZE/BANDAGES/DRESSINGS) ×2 IMPLANT
DURAPREP 26ML APPLICATOR (WOUND CARE) ×2 IMPLANT
ELECT REM PT RETURN 9FT ADLT (ELECTROSURGICAL) ×2
ELECTRODE REM PT RTRN 9FT ADLT (ELECTROSURGICAL) ×1 IMPLANT
FACESHIELD WRAPAROUND (MASK) IMPLANT
GLOVE BIO SURGEON STRL SZ8 (GLOVE) ×4 IMPLANT
GLOVE BIOGEL PI IND STRL 8 (GLOVE) ×2 IMPLANT
GLOVE BIOGEL PI INDICATOR 8 (GLOVE) ×2
GLOVE SS BIOGEL STRL SZ 8 (GLOVE) ×2 IMPLANT
GLOVE SUPERSENSE BIOGEL SZ 8 (GLOVE) ×2
GOWN STRL REUS W/ TWL LRG LVL3 (GOWN DISPOSABLE) ×1 IMPLANT
GOWN STRL REUS W/ TWL XL LVL3 (GOWN DISPOSABLE) ×4 IMPLANT
GOWN STRL REUS W/TWL 2XL LVL3 (GOWN DISPOSABLE) IMPLANT
GOWN STRL REUS W/TWL LRG LVL3 (GOWN DISPOSABLE) ×1
GOWN STRL REUS W/TWL XL LVL3 (GOWN DISPOSABLE) ×4
HANDPIECE INTERPULSE COAX TIP (DISPOSABLE) ×1
HOOD PEEL AWAY FACE SHEILD DIS (HOOD) ×4 IMPLANT
IMMOBILIZER KNEE 20 (SOFTGOODS) IMPLANT
IMMOBILIZER KNEE 22 UNIV (SOFTGOODS) ×2 IMPLANT
IMMOBILIZER KNEE 24 THIGH 36 (MISCELLANEOUS) IMPLANT
IMMOBILIZER KNEE 24 UNIV (MISCELLANEOUS)
KIT BASIN OR (CUSTOM PROCEDURE TRAY) ×2 IMPLANT
KIT ROOM TURNOVER OR (KITS) ×2 IMPLANT
MANIFOLD NEPTUNE II (INSTRUMENTS) ×2 IMPLANT
NS IRRIG 1000ML POUR BTL (IV SOLUTION) ×2 IMPLANT
PACK TOTAL JOINT (CUSTOM PROCEDURE TRAY) ×2 IMPLANT
PAD ARMBOARD 7.5X6 YLW CONV (MISCELLANEOUS) ×4 IMPLANT
SET HNDPC FAN SPRY TIP SCT (DISPOSABLE) ×1 IMPLANT
SPONGE GAUZE 4X4 12PLY (GAUZE/BANDAGES/DRESSINGS) ×2 IMPLANT
STAPLER VISISTAT 35W (STAPLE) IMPLANT
STRIP CLOSURE SKIN 1/2X4 (GAUZE/BANDAGES/DRESSINGS) ×2 IMPLANT
SUCTION FRAZIER TIP 10 FR DISP (SUCTIONS) ×2 IMPLANT
SUT MNCRL AB 3-0 PS2 18 (SUTURE) IMPLANT
SUT VIC AB 0 CT1 27 (SUTURE) ×1
SUT VIC AB 0 CT1 27XBRD ANBCTR (SUTURE) ×1 IMPLANT
SUT VIC AB 2-0 CT1 27 (SUTURE) ×2
SUT VIC AB 2-0 CT1 TAPERPNT 27 (SUTURE) ×2 IMPLANT
SUT VLOC 180 0 24IN GS25 (SUTURE) ×2 IMPLANT
SYR 50ML LL SCALE MARK (SYRINGE) ×2 IMPLANT
TOWEL OR 17X24 6PK STRL BLUE (TOWEL DISPOSABLE) ×2 IMPLANT
TOWEL OR 17X26 10 PK STRL BLUE (TOWEL DISPOSABLE) ×2 IMPLANT

## 2013-08-16 NOTE — Interval H&P Note (Signed)
History and Physical Interval Note:  08/16/2013 11:44 AM  Richard Morris  has presented today for surgery, with the diagnosis of LEFT KNEE DEGENERATIVE JOINT DISEASE  The various methods of treatment have been discussed with the patient and family. After consideration of risks, benefits and other options for treatment, the patient has consented to  Procedure(s): TOTAL KNEE ARTHROPLASTY (Left) as a surgical intervention .  The patient's history has been reviewed, patient examined, no change in status, stable for surgery.  I have reviewed the patient's chart and labs.  Questions were answered to the patient's satisfaction.     [name] G

## 2013-08-16 NOTE — Transfer of Care (Signed)
Immediate Anesthesia Transfer of Care Note  Patient: Richard Morris  Procedure(s) Performed: Procedure(s): TOTAL KNEE ARTHROPLASTY (Left)  Patient Location: PACU  Anesthesia Type:General  Level of Consciousness: awake, alert  and oriented  Airway & Oxygen Therapy: Patient Spontanous Breathing and Patient connected to nasal cannula oxygen  Post-op Assessment: Report given to PACU RN and Post -op Vital signs reviewed and stable  Post vital signs: Reviewed and stable  Complications: No apparent anesthesia complications

## 2013-08-16 NOTE — Anesthesia Procedure Notes (Signed)
Anesthesia Regional Block:  Adductor canal block  Pre-Anesthetic Checklist: ,, timeout performed, Correct Patient, Correct Site, Correct Laterality, Correct Procedure, Correct Position, site marked, Risks and benefits discussed,  Surgical consent,  Pre-op evaluation,  At surgeon's request and post-op pain management  Laterality: Left  Prep: chloraprep       Needles:   Needle Type: Other     Needle Length: 9cm 9 cm Needle Gauge: 21 and 21 G    Additional Needles:  Procedures: ultrasound guided (picture in chart) Adductor canal block Narrative:  Start time: 08/16/2013 10:22 AM End time: 08/16/2013 10:28 AM Injection made incrementally with aspirations every 5 mL.  Performed by: Personally  Anesthesiologist: C. Litha Lamartina MD  Additional Notes: Ultrasound guidance used to: id relevant anatomy, confirm needle position, local anesthetic spread, avoidance of vascular puncture. Picture saved. No complications. Block performed personally by Janetta Horaharles E. Gelene MinkFrederick, MD

## 2013-08-16 NOTE — Anesthesia Preprocedure Evaluation (Signed)
Anesthesia Evaluation  Patient identified by MRN, date of birth, ID band Patient awake    Reviewed: Allergy & Precautions, H&P , NPO status , Patient's Chart, lab work & pertinent test results, reviewed documented beta blocker date and time   History of Anesthesia Complications (+) PONV and history of anesthetic complications  Airway Mallampati: II TM Distance: >3 FB Neck ROM: full    Dental   Pulmonary neg pulmonary ROS, former smoker,  breath sounds clear to auscultation        Cardiovascular negative cardio ROS  Rhythm:regular     Neuro/Psych negative neurological ROS  negative psych ROS   GI/Hepatic negative GI ROS, Neg liver ROS,   Endo/Other  diabetes  Renal/GU negative Renal ROS  negative genitourinary   Musculoskeletal   Abdominal   Peds  Hematology negative hematology ROS (+)   Anesthesia Other Findings See surgeon's H&P   Reproductive/Obstetrics negative OB ROS                           Anesthesia Physical Anesthesia Plan  ASA: II  Anesthesia Plan: General   Post-op Pain Management:    Induction: Intravenous  Airway Management Planned: Oral ETT  Additional Equipment:   Intra-op Plan:   Post-operative Plan: Extubation in OR  Informed Consent: I have reviewed the patients History and Physical, chart, labs and discussed the procedure including the risks, benefits and alternatives for the proposed anesthesia with the patient or authorized representative who has indicated his/her understanding and acceptance.   Dental Advisory Given  Plan Discussed with: CRNA and Surgeon  Anesthesia Plan Comments:         Anesthesia Quick Evaluation

## 2013-08-16 NOTE — Progress Notes (Signed)
Orthopedic Tech Progress Note Patient Details:  Richard Morris 02/06/1961 [id number]  Patient is in a CPM machine. Can not apply knee immobilizer to the left knee at this time.   Patient ID: Richard Mawracy L Aguinaga, male   DOB: 07/23/1960, 53 y.o.   MRN: [id number]   Early CharsBaker,Casimer Russett Anthony 08/16/2013, 8:16 PM

## 2013-08-16 NOTE — Progress Notes (Signed)
Orthopedic Tech Progress Note Patient Details:  Richard Morris 06/27/1960 [id number]  CPM Left Knee CPM Left Knee: On Left Knee Flexion (Degrees): 60 Left Knee Extension (Degrees): 0 Additional Comments: Trapeze bar and foot roll   Shawnie PonsCammer, Richard Morris 08/16/2013, 3:18 PM

## 2013-08-16 NOTE — Plan of Care (Signed)
Problem: Consults Goal: Diagnosis- Total Joint Replacement Primary Total Knee Left     

## 2013-08-16 NOTE — Op Note (Signed)
PREOP DIAGNOSIS: DJD LEFT KNEE POSTOP DIAGNOSIS:  same PROCEDURE: LEFT TKR ANESTHESIA: General and block ATTENDING SURGEON: Orion Mole G ASSISTANT: Elodia FlorenceAndrew Nida PA  INDICATIONS FOR PROCEDURE: Richard Morris is a 53 y.o. male who has struggled for a long time with pain due to degenerative arthritis of the left knee.  The patient has failed many conservative non-operative measures and at this point has pain which limits the ability to sleep and walk.  The patient is offered total knee replacement.  Informed operative consent was obtained after discussion of possible risks of anesthesia, infection, neurovascular injury, DVT, and death.  The importance of the post-operative rehabilitation protocol to optimize result was stressed extensively with the patient.  SUMMARY OF FINDINGS AND PROCEDURE:  Richard Morris was taken to the operative suite where under the above anesthesia a left knee replacement was performed.  There were advanced degenerative changes and the bone quality was excellent.  We used the DePuy LCS system and placed size standard plus femur, 4 tibia, 41 mm all polyethylene patella, and a size 12.5 mm spacer.  Elodia FlorenceAndrew Nida PA-C assisted throughout and was invaluable to the completion of the case in that he helped retract and maintain exposure while I placed the components.  He also helped close thereby minimizing OR time.  The patient was admitted for appropriate post-op care to include perioperative antibiotics and mechanical and pharmacologic measures for DVT prophylaxis.  DESCRIPTION OF PROCEDURE:  Richard Morris was taken to the operative suite where the above anesthesia was applied.  The patient was positioned supine and prepped and draped in normal sterile fashion.  An appropriate time out was performed.  After the administration of Kefzol pre-op antibiotic the leg was elevated and exsanguinated and a tourniquet inflated.  A standard longitudinal incision was made on the anterior knee.   Dissection was carried down to the extensor mechanism.  All appropriate anti-infective measures were used including the pre-operative antibiotic, betadine impregnated drape, and closed hooded exhaust systems for each member of the surgical team.  A medial parapatellar incision was made in the extensor mechanism and the knee cap flipped and the knee flexed.  Some residual meniscal tissues were removed along with any remaining ACL/PCL tissue.  A guide was placed on the tibia and a flat cut was made on it's superior surface.  An intramedullary guide was placed in the femur and was utilized to make anterior and posterior cuts creating an appropriate flexion gap.  A second intramedullary guide was placed in the femur to make a distal cut properly balancing the knee with an extension gap equal to the flexion gap.  The three bones sized to the above mentioned sizes and the appropriate guides were placed and utilized.  A trial reduction was done and the knee easily came to full extension and the patella tracked well on flexion.  The trial components were removed and all bones were cleaned with pulsatile lavage and then dried thoroughly.  Cement was mixed and was pressurized onto the bones followed by placement of the aforementioned components.  Excess cement was trimmed and pressure was held on the components until the cement had hardened.  The tourniquet was deflated and a small amount of bleeding was controlled with cautery and pressure.  The knee was irrigated thoroughly.  The extensor mechanism was re-approximated with V-loc suture in running fashion.  The knee was flexed and the repair was solid.  The subcutaneous tissues were re-approximated with #0 and #2-0 vicryl and the  skin closed with a subcuticular stitch and steristrips.  A sterile dressing was applied.  Intraoperative fluids, EBL, and tourniquet time can be obtained from anesthesia records.  DISPOSITION:  The patient was taken to recovery room in stable  condition and admitted for appropriate post-op care to include peri-operative antibiotic and DVT prophylaxis with mechanical and pharmacologic measures.  Taleyah Hillman G 08/16/2013, 1:38 PM

## 2013-08-17 ENCOUNTER — Encounter (HOSPITAL_COMMUNITY): Payer: Self-pay | Admitting: Orthopaedic Surgery

## 2013-08-17 LAB — BASIC METABOLIC PANEL
ANION GAP: 14 (ref 5–15)
BUN: 12 mg/dL (ref 6–23)
CHLORIDE: 98 meq/L (ref 96–112)
CO2: 25 meq/L (ref 19–32)
CREATININE: 0.79 mg/dL (ref 0.50–1.35)
Calcium: 9.2 mg/dL (ref 8.4–10.5)
GFR calc Af Amer: >90 mL/min (ref >90)
GFR calc non Af Amer: >90 mL/min (ref >90)
GLUCOSE: 114 mg/dL — AB (ref 70–99)
Potassium: 4.2 mEq/L (ref 3.7–5.3)
Sodium: 137 mEq/L (ref 137–147)

## 2013-08-17 LAB — GLUCOSE, CAPILLARY
GLUCOSE-CAPILLARY: 122 mg/dL — AB (ref 70–99)
GLUCOSE-CAPILLARY: 94 mg/dL (ref 70–99)
Glucose-Capillary: 117 mg/dL — ABNORMAL HIGH (ref 70–99)
Glucose-Capillary: 130 mg/dL — ABNORMAL HIGH (ref 70–99)

## 2013-08-17 LAB — CBC
HCT: 38.6 % — ABNORMAL LOW (ref 39.0–52.0)
Hemoglobin: 13.2 g/dL (ref 13.0–17.0)
MCH: 29.3 pg (ref 26.0–34.0)
MCHC: 34.2 g/dL (ref 30.0–36.0)
MCV: 85.6 fL (ref 78.0–100.0)
PLATELETS: 263 10*3/uL (ref 150–400)
RBC: 4.51 MIL/uL (ref 4.22–5.81)
RDW: 13.6 % (ref 11.5–15.5)
WBC: 16.9 10*3/uL — AB (ref 4.0–10.5)

## 2013-08-17 LAB — PATHOLOGIST SMEAR REVIEW

## 2013-08-17 NOTE — Evaluation (Signed)
Occupational Therapy Evaluation Patient Details Name: Richard Morris L Bloxom MRN: [id number] DOB: 08/22/1960 Today's Date: 08/17/2013    History of Present Illness s/p Lt TKA 08/16/13.   Clinical Impression   Pt admitted with the above diagnoses and seen for acute OT eval. PTA pt was independent with ADLs. Pt is currently at supervision to min guard level for LB ADLs. Pt's spouse and children will provide 24 hour supervision/assistance at d/c. Educated pt on techniques and DME use for safe completion of ADLs with knee precautions. Pt had R TKA in 2013 and is familiar with ADL techniques. No further OT services indicated at this time.     Follow Up Recommendations  Supervision/Assistance - 24 hour;No OT follow up    Equipment Recommendations  3 in 1 bedside comode    Recommendations for Other Services       Precautions / Restrictions Precautions Precautions: Knee Precaution Comments: reviewed Required Braces or Orthoses: Knee Immobilizer - Left Restrictions Weight Bearing Restrictions: Yes LLE Weight Bearing: Weight bearing as tolerated      Mobility Bed Mobility              General bed mobility comments: pt in recliner  Transfers Overall transfer level: Needs assistance Equipment used: Rolling walker (2 wheeled) Transfers: Sit to/from Stand Sit to Stand: Min guard             Balance                                           ADL Overall ADL's : Needs assistance/impaired Eating/Feeding: Set up;Sitting   Grooming: Supervision/safety;Standing   Upper Body Bathing: Sitting;Set up   Lower Body Bathing: Min guard;Sit to/from stand   Upper Body Dressing : Set up;Sitting   Lower Body Dressing: Min guard;Sit to/from stand   Toilet Transfer: Supervision/safety;Ambulation;RW (3n1 over toilet)   Toileting- Clothing Manipulation and Hygiene: Supervision/safety;Sit to/from stand   Tub/ Shower Transfer: Supervision/safety;Ambulation;3 in 1;Rolling  walker   Functional mobility during ADLs: Supervision/safety;Rolling walker General ADL Comments: Educated pt on techniques and DME use for safe completion of ADLs with knee precautions.       Vision                     Perception     Praxis      Pertinent Vitals/Pain Mild pain L knee     Hand Dominance     Extremity/Trunk Assessment Upper Extremity Assessment Upper Extremity Assessment: Overall WFL for tasks assessed   Lower Extremity Assessment Lower Extremity Assessment: Defer to PT evaluation    Cervical / Trunk Assessment Cervical / Trunk Assessment: Normal   Communication Communication Communication: No difficulties   Cognition Arousal/Alertness: Awake/alert Behavior During Therapy: WFL for tasks assessed/performed Overall Cognitive Status: Within Functional Limits for tasks assessed                     General Comments       Exercises      Shoulder Instructions      Home Living Family/patient expects to be discharged to:: Private residence Living Arrangements: Spouse/significant other Available Help at Discharge: Family;Available 24 hours/day Type of Home: House Home Access: Stairs to enter Enter[employer] CorporationEntrance Stairs-Number of Steps: 3 Entrance Stairs-Rails: Left;Right;Can reach both Home Layout: Able to live on main level with bedroom/bathroom     Bathroom Shower/Tub: Walk-in shower  Bathroom Toilet: Handicapped height Bathroom Accessibility: Yes How Accessible: Accessible via walker Home Equipment: Walker - 2 wheels;Bedside commode;Shower seat;Grab bars - toilet;Grab bars - tub/shower   Additional Comments: pt has handicap bathroom/shower      Prior Functioning/Environment Level of Independence: Independent             OT Diagnosis:     OT Problem List:     OT Treatment/Interventions:      OT Goals(Current goals can be found in the care plan section) Acute Rehab OT Goals Patient Stated Goal: home, trips with family  OT  Frequency:     Barriers to D/C:            Co-evaluation              End of Session Equipment Utilized During Treatment: Gait belt;Rolling walker;Left knee immobilizer CPM Left Knee CPM Left Knee: Off Left Knee Flexion (Degrees): 60 Left Knee Extension (Degrees): 0 Additional Comments: on at 0630  Activity Tolerance: Patient tolerated treatment well Patient left: in chair;with call bell/phone within reach;Other (comment) (in footsie roll)   Time: [id number] OT Time Calculation (min): 14 min Charges:  OT General Charges $OT Visit: 1 Procedure OT Evaluation $Initial OT Evaluation Tier I: 1 Procedure OT Treatments $Self Care/Home Management : 8-22 mins G-Codes:    Pilar GrammesMathews, Landree Fernholz H 08/17/2013, 10:10 AM

## 2013-08-17 NOTE — Evaluation (Signed)
Physical Therapy Evaluation Patient Details Name: Richard Morris MRN: [id number] DOB: 01/31/1961 Today's Date: 08/17/2013   History of Present Illness  s/p Lt TKA 08/16/13.  Clinical Impression  Pt is s/p Lt TKA POD#1 resulting in the deficits listed below (see PT Problem List). Pt will benefit from skilled PT to increase their independence and safety with mobility to allow discharge to the venue listed below. Pt highly motivated to return to [facility] and plans to travel to [place] in 5 weeks. Anticipate good rehab progress.     Follow Up Recommendations Home health PT;Supervision for mobility/OOB    Equipment Recommendations  None recommended by PT    Recommendations for Other Services OT consult     Precautions / Restrictions Precautions Precautions: Knee Precaution Comments: given TKA exercise handout; educated no pillow uner knee  Required Braces or Orthoses: Knee Immobilizer - Left Restrictions Weight Bearing Restrictions: Yes LLE Weight Bearing: Weight bearing as tolerated      Mobility  Bed Mobility Overal bed mobility: Modified Independent             General bed mobility comments: HOB elevated and use of handrails   Transfers Overall transfer level: Needs assistance Equipment used: Rolling walker (2 wheeled) Transfers: Sit to/from Stand Sit to Stand: Min guard         General transfer comment: min guard to steady when transitioning hands to RW; cues for hand placement and safety   Ambulation/Gait Ambulation/Gait assistance: Supervision Ambulation Distance (Feet): 200 Feet Assistive device: Rolling walker (2 wheeled) Gait Pattern/deviations: Step-through pattern;Decreased stance time - left;Decreased step length - right;Antalgic;Narrow base of support Gait velocity: decreased Gait velocity interpretation: Below normal speed for age/gender General Gait Details: cues for step through gt; pt with antalgic gt due to incr pain with WB through LT LE  Stairs             Wheelchair Mobility    Modified Rankin (Stroke Patients Only)       Balance Overall balance assessment: No apparent balance deficits (not formally assessed)                                           Pertinent Vitals/Pain 7/10; patient repositioned for comfort; pt premedicated by RN prior to session     Home Living Family/patient expects to be discharged to:: Private residence Living Arrangements: Spouse/significant other Available Help at Discharge: Family;Available 24 hours/day Type of Home: House Home Access: Stairs to enter Entrance Stairs-Rails: Left;Right;Can reach both Entrance Stairs-Number of Steps: 3 Home Layout: Able to live on main level with bedroom/bathroom Home Equipment: Walker - 2 wheels;Bedside commode;Shower seat;Grab bars - toilet;Grab bars - tub/shower Additional Comments: pt has handicap bathroom/shower    Prior Function Level of Independence: Independent               Hand Dominance        Extremity/Trunk Assessment   Upper Extremity Assessment: Defer to OT evaluation           Lower Extremity Assessment: LLE deficits/detail   LLE Deficits / Details: knee 3/5; AROM in sitting 55 degrees flexion   Cervical / Trunk Assessment: Normal  Communication   Communication: No difficulties  Cognition Arousal/Alertness: Awake/alert Behavior During Therapy: WFL for tasks assessed/performed Overall Cognitive Status: Within Functional Limits for tasks assessed  General Comments      Exercises Total Joint Exercises Ankle Circles/Pumps: AROM;Both;10 reps;Seated Quad Sets: AROM;Left;Strengthening;10 reps;Seated Hip ABduction/ADduction: AAROM;Strengthening;Left;10 reps;Seated      Assessment/Plan    PT Assessment Patient needs continued PT services  PT Diagnosis Abnormality of gait;Generalized weakness;Acute pain   PT Problem List Decreased strength;Decreased range of  motion;Decreased activity tolerance;Decreased balance;Decreased mobility;Decreased knowledge of use of DME;Pain  PT Treatment Interventions DME instruction;Gait training;Stair training;Functional mobility training;Therapeutic activities;Therapeutic exercise;Balance training;Neuromuscular re-education;Patient/family education   PT Goals (Current goals can be found in the Care Plan section) Acute Rehab PT Goals Patient Stated Goal: to go home tomorrow PT Goal Formulation: With patient Time For Goal Achievement: 08/20/13 Potential to Achieve Goals: Good    Frequency 7X/week   Barriers to discharge        Co-evaluation               End of Session Equipment Utilized During Treatment: Gait belt;Left knee immobilizer Activity Tolerance: Patient tolerated treatment well Patient left: in chair;with call bell/phone within reach Nurse Communication: Mobility status         Time: [id number] PT Time Calculation (min): 23 min   Charges:   PT Evaluation $Initial PT Evaluation Tier I: 1 Procedure PT Treatments $Gait Training: 8-22 mins   PT G CodesDonell Sievert:          Jak Haggar N, [place]  [phone] 08/17/2013, 8:29 AM

## 2013-08-17 NOTE — Progress Notes (Signed)
Subjective: 1 Day Post-Op Procedure(s) (LRB): TOTAL KNEE ARTHROPLASTY (Left)  Activity level:  wbat Diet tolerance:  eating Voiding:  ok Patient reports pain as mild.    Objective: Vital signs in last 24 hours: Temp:  [97.2 F (36.2 C)-98.4 F (36.9 C)] 98.1 F (36.7 C) (07/08 16100632) Pulse Rate:  [69-107] 91 (07/08 0632) Resp:  [10-27] 16 (07/08 96040632) BP: (104-138)/(68-92) 113/75 mmHg (07/08 0632) SpO2:  [95 %-100 %] 98 % (07/08 54090632) Weight:  [99.338 kg (219 lb)] 99.338 kg (219 lb) (07/07 0923)  Labs:  Recent Labs  08/16/13 1004 08/17/13 0545  HGB 14.1 13.2    Recent Labs  08/16/13 1004 08/17/13 0545  WBC 9.0 16.9*  RBC 4.82 4.51  HCT 41.7 38.6*  PLT 229 263   No results found for this basename: NA, K, CL, CO2, BUN, CREATININE, GLUCOSE, CALCIUM,  in the last 72 hours  Recent Labs  08/16/13 1004  INR 0.97    Physical Exam:  Neurologically intact ABD soft Neurovascular intact Sensation intact distally Intact pulses distally Dorsiflexion/Plantar flexion intact Incision: dressing C/D/I No cellulitis present Compartment soft  Assessment/Plan:  1 Day Post-Op Procedure(s) (LRB): TOTAL KNEE ARTHROPLASTY (Left) Advance diet Up with therapy D/C IV fluids Plan for discharge tomorrow Discharge home with home health Continue on ASA 325mg  BID x 2 weeks Follow up in office 2 weeks post op. Change dressing to mepilex.     Kruti Horacek, Ginger OrganNDREW PAUL 08/17/2013, 7:34 AM

## 2013-08-17 NOTE — Anesthesia Postprocedure Evaluation (Signed)
Anesthesia Post Note  Patient: Richard Morris  Procedure(s) Performed: Procedure(s) (LRB): TOTAL KNEE ARTHROPLASTY (Left)  Anesthesia type: General  Patient location: PACU  Post pain: Pain level controlled  Post assessment: Patient's Cardiovascular Status Stable  Last Vitals:  Filed Vitals:   08/17/13 0632  BP: 113/75  Pulse: 91  Temp: 36.7 C  Resp: 16    Post vital signs: Reviewed and stable  Level of consciousness: alert  Complications: No apparent anesthesia complications

## 2013-08-17 NOTE — Progress Notes (Signed)
Physical Therapy Treatment Patient Details Name: Richard Morris MRN: [id number] DOB: 12/17/1960 Today's Date: 08/17/2013    History of Present Illness s/p Lt TKA 08/16/13.    PT Comments    Pt progressing steadily with therapy. Was able to manage stairs with min guard (A) and cues for sequencing. Will benefit from additional session prior to D/C to review HEP and review stair management technique.   Follow Up Recommendations  Home health PT;Supervision for mobility/OOB     Equipment Recommendations  None recommended by PT    Recommendations for Other Services       Precautions / Restrictions Precautions Precautions: Knee Precaution Comments: reinforced to not bend bottom of bed up to avoid contractures  Required Braces or Orthoses: Knee Immobilizer - Left Restrictions Weight Bearing Restrictions: Yes LLE Weight Bearing: Weight bearing as tolerated    Mobility  Bed Mobility Overal bed mobility: Modified Independent             General bed mobility comments: use of handrails and HOB elevated  Transfers Overall transfer level: Needs assistance Equipment used: Rolling walker (2 wheeled) Transfers: Sit to/from Stand Sit to Stand: Supervision         General transfer comment: supervision for min cues for hand placement and sequencing   Ambulation/Gait Ambulation/Gait assistance: Supervision Ambulation Distance (Feet): 250 Feet Assistive device: Rolling walker (2 wheeled) Gait Pattern/deviations: Step-through pattern;Decreased stance time - left;Decreased step length - right;Antalgic;Narrow base of support Gait velocity: able to increase minimally with step through gt progression Gait velocity interpretation: Below normal speed for age/gender General Gait Details: beginning to ambulate with step through gt sequencing; cues for upright posture and management of RW    Stairs Stairs: Yes Stairs assistance: Min guard Stair Management: Step to pattern;Two  rails;Forwards Number of Stairs: 5 General stair comments: cues got sequencing and min guard to steady  Wheelchair Mobility    Modified Rankin (Stroke Patients Only)       Balance Overall balance assessment: Needs assistance Sitting-balance support: Feet supported;No upper extremity supported Sitting balance-Leahy Scale: Good     Standing balance support: During functional activity;No upper extremity supported Standing balance-Leahy Scale: Fair Standing balance comment: able to stand minimal time without UE support                     Cognition Arousal/Alertness: Awake/alert Behavior During Therapy: WFL for tasks assessed/performed Overall Cognitive Status: Within Functional Limits for tasks assessed                      Exercises Total Joint Exercises Ankle Circles/Pumps: AROM;Both;10 reps;Seated Short Arc Quad: AROM;Left;10 reps;Supine Heel Slides: AAROM;Left;10 reps;Seated;Other (comment) (use of sheet for AAROM ) Hip ABduction/ADduction: AAROM;Strengthening;Left;10 reps;Seated Straight Leg Raises: AROM;Left;5 reps;Supine Goniometric ROM: AAROM in supine 55 degrees.  Other Exercises Other Exercises: pt educated on proper controls for CPM management     General Comments         Pertinent Vitals/Pain 7/10; pt premedicated from RN; tolerating CPM     Home Living                      Prior Function            PT Goals (current goals can now be found in the care plan section) Acute Rehab PT Goals Patient Stated Goal: home, trips with family PT Goal Formulation: With patient Time For Goal Achievement: 08/20/13 Potential to Achieve Goals: Good Progress towards  PT goals: Progressing toward goals    Frequency  7X/week    PT Plan Current plan remains appropriate    Co-evaluation             End of Session Equipment Utilized During Treatment: Left knee immobilizer Activity Tolerance: Patient tolerated treatment well Patient  left: with call bell/phone within reach;in bed;in CPM     Time: [id number] PT Time Calculation (min): 24 min  Charges:  $Gait Training: 8-22 mins $Therapeutic Exercise: 8-22 mins                    G CodesDonell Sievert:      Waverly Tarquinio N, [place]  [phone] 08/17/2013, 2:40 PM

## 2013-08-17 NOTE — Progress Notes (Signed)
Clinical Social Worker received referral for possible ST-SNF placement.  Chart reviewed.  PT/OT recommending home with home health.  CSW will follow up with RN Case Manager who will check with patient to discuss home health needs.    CSW signing off - please re consult if social work needs arise.  Maree KrabbeLindsay Liliya Fullenwider, MSW, LCSWA [phone]    Maree KrabbeLindsay Cristopher Ciccarelli, MSW, Theresia MajorsLCSWA [phone]

## 2013-08-17 NOTE — Care Management Note (Signed)
CARE MANAGEMENT NOTE 08/17/2013  Patient:  Richard Morris,Richard Morris   Account Number:  [id number]  Date Initiated:  08/17/2013  Documentation initiated by:  Vance PeperBRADY,Jarren Para  Subjective/Objective Assessment:   53 yr old male s/p left total knee arthroplasty.     Action/Plan:   Case manager spoke with patient concerning home health and DME needs at discharge. patient preoperatively setup with [facility], no changes. Patient has family support at discharge.   Anticipated DC Date:  08/18/2013   Anticipated DC Plan:  HOME W HOME HEALTH SERVICES      DC Planning Services  CM consult      [facility]  HOME HEALTH  DURABLE MEDICAL EQUIPMENT   Choice offered to / List presented to:  C-1 Patient   DME arranged  3-N-1  [facility]  CPM      DME agency  [employer]     HH arranged  HH-2 PT      HH agency  [facility]   Status of service:  Completed, signed off Medicare Important Message given?   (If response is "NO", the following Medicare IM given date fields will be blank) Date Medicare IM given:   Medicare IM given by:   Date Additional Medicare IM given:   Additional Medicare IM given by:    Discharge Disposition:  HOME W HOME HEALTH SERVICES  Per UR Regulation:  Reviewed for med. necessity/level of care/duration of stay

## 2013-08-17 NOTE — Progress Notes (Signed)
Utilization review completed.  

## 2013-08-18 LAB — CBC
HEMATOCRIT: 36.9 % — AB (ref 39.0–52.0)
Hemoglobin: 12.2 g/dL — ABNORMAL LOW (ref 13.0–17.0)
MCH: 28.4 pg (ref 26.0–34.0)
MCHC: 33.1 g/dL (ref 30.0–36.0)
MCV: 86 fL (ref 78.0–100.0)
Platelets: 244 10*3/uL (ref 150–400)
RBC: 4.29 MIL/uL (ref 4.22–5.81)
RDW: 13.7 % (ref 11.5–15.5)
WBC: 13.7 10*3/uL — ABNORMAL HIGH (ref 4.0–10.5)

## 2013-08-18 LAB — GLUCOSE, CAPILLARY
GLUCOSE-CAPILLARY: 112 mg/dL — AB (ref 70–99)
Glucose-Capillary: 158 mg/dL — ABNORMAL HIGH (ref 70–99)

## 2013-08-18 MED ORDER — HYDROCODONE-ACETAMINOPHEN 5-325 MG PO TABS: 1.0000 | ORAL_TABLET | Freq: Four times a day (QID) | ORAL | Status: DC | PRN

## 2013-08-18 MED ORDER — METHOCARBAMOL 500 MG PO TABS: 500.0000 mg | ORAL_TABLET | Freq: Four times a day (QID) | ORAL | Status: DC | PRN

## 2013-08-18 MED ORDER — ASPIRIN 325 MG PO TBEC: 325.0000 mg | DELAYED_RELEASE_TABLET | Freq: Two times a day (BID) | ORAL | Status: AC

## 2013-08-18 NOTE — Progress Notes (Signed)
Physical Therapy Treatment Patient Details Name: Richard Morris MRN: [id number] DOB: 01/03/1961 Today's Date: 08/18/2013    History of Present Illness s/p Lt TKA 08/16/13.    PT Comments    Pt. Was slightly limited by nausea and cramp in L thigh. Pt. C/o pain in L calf, nurse was made aware. Pt. Was educated on HEP. Pt planning to D/C this afternoon.   Follow Up Recommendations  Home health PT;Supervision for mobility/OOB     Equipment Recommendations  None recommended by PT    Recommendations for Other Services       Precautions / Restrictions Precautions Precautions: Knee Restrictions Weight Bearing Restrictions: Yes LLE Weight Bearing: Weight bearing as tolerated    Mobility  Bed Mobility Overal bed mobility: Modified Independent             General bed mobility comments: use of handrails and mattress   Transfers Overall transfer level: Needs assistance Equipment used: Rolling walker (2 wheeled) Transfers: Sit to/from Stand Sit to Stand: Supervision         General transfer comment: supervision for min cues for sequencing  Ambulation/Gait Ambulation/Gait assistance: Min guard Ambulation Distance (Feet): 250 Feet Assistive device: Rolling walker (2 wheeled) Gait Pattern/deviations: Step-through pattern;Antalgic;Narrow base of support;Decreased step length - right Gait velocity: able to increase minimally with step through gt progression Gait velocity interpretation: Below normal speed for age/gender General Gait Details: continuous cues for heel strike and extension/flexion of leg.    Stairs Stairs: Yes Stairs assistance: Min guard Stair Management: Two rails;Forwards;Step to pattern Number of Stairs: 5 General stair comments: min guard for safety. Pt. was able to remember sequencing on own and used safe speed.  Wheelchair Mobility    Modified Rankin (Stroke Patients Only)       Balance                                     Cognition Arousal/Alertness: Awake/alert Behavior During Therapy: WFL for tasks assessed/performed Overall Cognitive Status: Within Functional Limits for tasks assessed                      Exercises Total Joint Exercises Ankle Circles/Pumps: AROM;20 reps;Seated;Both Quad Sets: AROM;Left;Strengthening;10 reps;Seated Heel Slides: AAROM;Left;5 reps;Seated Hip ABduction/ADduction: AAROM;Strengthening;10 reps;Seated;Left Straight Leg Raises: Left;5 reps;Seated;AAROM Long Arc Quad: AAROM;Left;10 reps;Seated    General Comments        Pertinent Vitals/Pain Pt did not rate pain. Pt repositioned for comfort with knee in max extension.    Home Living                      Prior Function            PT Goals (current goals can now be found in the care plan section) Progress towards PT goals: Progressing toward goals    Frequency  7X/week    PT Plan Current plan remains appropriate    Co-evaluation             End of Session Equipment Utilized During Treatment: Gait belt Activity Tolerance: Patient limited by pain;Other (comment) Patient left: in chair;with call bell/phone within reach     Time: 0901-0932 PT Time Calculation (min): 31 min  Charges:  $Gait Training: 8-22 mins $Therapeutic Exercise: 8-22 mins                    G Codes:  Fredrich Birks 08/18/2013, 9:44 AM 08/18/2013 Fredrich Birks PTA [phone] pager [phone] office

## 2013-08-18 NOTE — Discharge Summary (Signed)
Patient ID: Richard Morris MRN: [id number] DOB/AGE: 54/09/1960 54 y.o.  Admit date: 08/16/2013 Discharge date: 08/18/2013  Admission Diagnoses:  Principal Problem:   Left knee DJD   Discharge Diagnoses:  Same  Past Medical History  Diagnosis Date  . PONV (postoperative nausea and vomiting)   . Blood transfusion 2009    s/p hip replacement  . Arthritis     degenerative joint disease  . Diabetes mellitus without complication     dropped 17 lbs. , no meds. yet     Surgeries: Procedure(s): TOTAL KNEE ARTHROPLASTY on 08/16/2013   Consultants:    Discharged Condition: Improved  Hospital Course: Richard Morris is an 53 y.o. male who was admitted 08/16/2013 for operative treatment ofLeft knee DJD. Patient has severe unremitting pain that affects sleep, daily activities, and work/hobbies. After pre-op clearance the patient was taken to the operating room on 08/16/2013 and underwent  Procedure(s): TOTAL KNEE ARTHROPLASTY.    Patient was given perioperative antibiotics: Anti-infectives   Start     Dose/Rate Route Frequency Ordered Stop   08/16/13 1830  ceFAZolin (ANCEF) IVPB 2 g/50 mL premix     2 g 100 mL/hr over 30 Minutes Intravenous Every 6 hours 08/16/13 1747 08/17/13 0214   08/16/13 0600  ceFAZolin (ANCEF) IVPB 2 g/50 mL premix     2 g 100 mL/hr over 30 Minutes Intravenous On call to O.R. 08/15/13 1404 08/16/13 1202       Patient was given sequential compression devices, early ambulation, and chemoprophylaxis to prevent DVT.  Patient benefited maximally from hospital stay and there were no complications.    Recent vital signs: Patient Vitals for the past 24 hrs:  BP Temp Pulse Resp SpO2  08/18/13 0438 127/76 mmHg 98.1 F (36.7 C) 107 18 95 %  08/17/13 2211 121/61 mmHg 98.2 F (36.8 C) 112 17 94 %  08/17/13 1400 121/72 mmHg 98.5 F (36.9 C) 96 18 97 %     Recent laboratory studies:  Recent Labs  08/16/13 1004 08/17/13 0545 08/18/13 0348  WBC 9.0 16.9* 13.7*  HGB  14.1 13.2 12.2*  HCT 41.7 38.6* 36.9*  PLT 229 263 244  NA  --  137  --   K  --  4.2  --   CL  --  98  --   CO2  --  25  --   BUN  --  12  --   CREATININE  --  0.79  --   GLUCOSE  --  114*  --   INR 0.97  --   --   CALCIUM  --  9.2  --      Discharge Medications:     Medication List    STOP taking these medications       meloxicam 7.5 MG tablet  Commonly known as:  MOBIC      TAKE these medications       Alfalfa 650 MG Tabs  Take 1,300 mg by mouth daily.     aspirin 325 MG EC tablet  Take 1 tablet (325 mg total) by mouth 2 (two) times daily after a meal.     cholecalciferol 1000 UNITS tablet  Commonly known as:  VITAMIN D  Take 1,000 Units by mouth daily.     HYDROcodone-acetaminophen 5-325 MG per tablet  Commonly known as:  NORCO/VICODIN  Take 1-2 tablets by mouth every 6 (six) hours as needed (breakthrough pain).     methocarbamol 500 MG tablet  Commonly known  as:  ROBAXIN  Take 1 tablet (500 mg total) by mouth every 6 (six) hours as needed for muscle spasms.     OMEGA 3 PO  Take 500 mg by mouth daily.     vitamin B-12 500 MCG tablet  Commonly known as:  CYANOCOBALAMIN  Take 500 mcg by mouth daily.     vitamin C 500 MG tablet  Commonly known as:  ASCORBIC ACID  Take 500 mg by mouth daily.        Diagnostic Studies: Dg Chest 2 View  08/16/2013   CLINICAL DATA:  Preop for knee arthroplasty.  History of smoking.  EXAM: CHEST  2 VIEW  COMPARISON:  05/20/2011  FINDINGS: The heart size and mediastinal contours are within normal limits. Both lungs are clear. The bony thorax is intact. There are degenerative changes of the AC joints and thoracic spine.  IMPRESSION: No active cardiopulmonary disease.   Electronically Signed   By: Amie Portland M.D.   On: 08/16/2013 10:08    Disposition: 06-Home-Health Care Svc      Discharge Instructions   Call MD / Call 911    Complete by:  As directed   If you experience chest pain or shortness of breath, CALL 911 and be  transported to the hospital emergency room.  If you develope a fever above 101 F, pus (white drainage) or increased drainage or redness at the wound, or calf pain, call your surgeon's office.     Constipation Prevention    Complete by:  As directed   Drink plenty of fluids.  Prune juice may be helpful.  You may use a stool softener, such as Colace (over the counter) 100 mg twice a day.  Use MiraLax (over the counter) for constipation as needed.     Diet - low sodium heart healthy    Complete by:  As directed      Increase activity slowly as tolerated    Complete by:  As directed            Follow-up Information   Follow up with Velna Ochs, MD. Call in 2 weeks.   Specialty:  Orthopedic Surgery   Contact information:   [address] [place] [address] [phone]       Follow up with [facility]. (Someone from [facility] will contact you concerning start date and time for physical therapy.)    Contact information:   [address] [place] [address] [phone]        Signed: Drema Halon 08/18/2013, 12:21 PM

## 2013-08-23 ENCOUNTER — Ambulatory Visit: Payer: Managed Care, Other (non HMO)

## 2013-08-24 ENCOUNTER — Telehealth: Payer: Self-pay | Admitting: Hematology and Oncology

## 2013-08-24 NOTE — Telephone Encounter (Signed)
CALLED PATIENT TO SCHEDULE GENETIC SCHEDUILE [employer] APPT PER PATIENT WANTS TO CHECK WITH INSURANCE.

## 2013-08-30 ENCOUNTER — Ambulatory Visit: Payer: Managed Care, Other (non HMO)

## 2018-01-12 ENCOUNTER — Encounter (HOSPITAL_COMMUNITY): Payer: Self-pay | Admitting: Emergency Medicine

## 2018-01-12 ENCOUNTER — Other Ambulatory Visit: Payer: Self-pay

## 2018-01-12 ENCOUNTER — Emergency Department (HOSPITAL_COMMUNITY): Payer: Managed Care, Other (non HMO)

## 2018-01-12 ENCOUNTER — Emergency Department (HOSPITAL_COMMUNITY)
Admission: EM | Admit: 2018-01-12 | Discharge: 2018-01-12 | Disposition: A | Discharge status: Home / Self Care | Payer: Managed Care, Other (non HMO) | Attending: Emergency Medicine | Admitting: Emergency Medicine

## 2018-01-12 DIAGNOSIS — W298XXA Contact with other powered powered hand tools and household machinery, initial encounter: Secondary | ICD-10-CM | POA: Insufficient documentation

## 2018-01-12 DIAGNOSIS — E119 Type 2 diabetes mellitus without complications: Secondary | ICD-10-CM | POA: Diagnosis not present

## 2018-01-12 DIAGNOSIS — S68623A Partial traumatic transphalangeal amputation of left middle finger, initial encounter: Secondary | ICD-10-CM | POA: Diagnosis present

## 2018-01-12 DIAGNOSIS — Z7982 Long term (current) use of aspirin: Secondary | ICD-10-CM | POA: Insufficient documentation

## 2018-01-12 DIAGNOSIS — Z23 Encounter for immunization: Secondary | ICD-10-CM | POA: Diagnosis not present

## 2018-01-12 DIAGNOSIS — Z79899 Other long term (current) drug therapy: Secondary | ICD-10-CM | POA: Diagnosis not present

## 2018-01-12 DIAGNOSIS — Z87891 Personal history of nicotine dependence: Secondary | ICD-10-CM | POA: Diagnosis not present

## 2018-01-12 DIAGNOSIS — Y929 Unspecified place or not applicable: Secondary | ICD-10-CM | POA: Insufficient documentation

## 2018-01-12 DIAGNOSIS — Y999 Unspecified external cause status: Secondary | ICD-10-CM | POA: Insufficient documentation

## 2018-01-12 DIAGNOSIS — S61209A Unspecified open wound of unspecified finger without damage to nail, initial encounter: Secondary | ICD-10-CM

## 2018-01-12 DIAGNOSIS — Y9389 Activity, other specified: Secondary | ICD-10-CM | POA: Diagnosis not present

## 2018-01-12 MED ORDER — CEPHALEXIN 500 MG PO CAPS: 500.0000 mg | ORAL_CAPSULE | Freq: Three times a day (TID) | ORAL | 0 refills | Status: DC

## 2018-01-12 MED ORDER — OXYCODONE-ACETAMINOPHEN 5-325 MG PREPACK: ORAL_TABLET | ORAL | 0 refills | Status: DC

## 2018-01-12 MED ORDER — CEPHALEXIN 500 MG PO CAPS
500.0000 mg | ORAL_CAPSULE | Freq: Once | ORAL | Status: AC
  Administered 2018-01-12: 500 mg via ORAL
  Filled 2018-01-12: qty 1

## 2018-01-12 MED ORDER — POVIDONE-IODINE 10 % EX SOLN
CUTANEOUS | Status: DC | PRN
  Administered 2018-01-12: 2 via TOPICAL
  Filled 2018-01-12: qty 30

## 2018-01-12 MED ORDER — OXYCODONE-ACETAMINOPHEN 5-325 MG PO TABS
1.0000 | ORAL_TABLET | Freq: Once | ORAL | Status: AC
  Administered 2018-01-12: 1 via ORAL
  Filled 2018-01-12: qty 1

## 2018-01-12 MED ORDER — TETANUS-DIPHTH-ACELL PERTUSSIS 5-2.5-18.5 LF-MCG/0.5 IM SUSP
0.5000 mL | Freq: Once | INTRAMUSCULAR | Status: AC
  Administered 2018-01-12: 0.5 mL via INTRAMUSCULAR
  Filled 2018-01-12: qty 0.5

## 2018-01-12 MED ORDER — OXYCODONE-ACETAMINOPHEN 5-325 MG PO TABS: 1.0000 | ORAL_TABLET | ORAL | 0 refills | Status: DC | PRN

## 2018-01-12 NOTE — ED Notes (Signed)
ED Provider at bedside. 

## 2018-01-12 NOTE — ED Provider Notes (Signed)
[facility] EMERGENCY DEPARTMENT Provider Note   CSN: [id number] Arrival date & time: 01/12/18  1740     History   Chief Complaint Chief Complaint  Patient presents with  . Hand Injury    HPI Richard Morris is a 57 y.o. male.  HPI   Richard Morris is a 57 y.o. male who presents to the Emergency Department complaining of an avulsion injury to the distal tip of his left middle finger that occurred shortly before ER arrival.  He states that he was using a type of woodworking saw when the injury occurred.  He is controlled bleeding with direct pressure.  He states the bleeding was minimal and has been controlled prior to arrival.  He denies use of blood thinners.  Pain is localized to the distal tip of the finger.  No involvement of the nail.  He denies pain to the proximal finger hand or wrist.   Past Medical History:  Diagnosis Date  . Arthritis    degenerative joint disease  . Blood transfusion 2009   s/p hip replacement  . Diabetes mellitus without complication (HCC)    dropped 17 lbs. , no meds. yet   . PONV (postoperative nausea and vomiting)     Patient Active Problem List   Diagnosis Date Noted  . Left knee DJD 08/16/2013  . 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 BACK SURGERY  2005   spinal cyst  . JOINT REPLACEMENT  2009   left hip  . TOTAL KNEE ARTHROPLASTY  05/27/2011   Procedure: TOTAL KNEE ARTHROPLASTY;  Surgeon: Velna OchsPeter G Dalldorf, MD;  Location: [facility] OR;  Service: Orthopedics;  Laterality: Right;  DEPUY  . TOTAL KNEE ARTHROPLASTY Left 08/16/2013   Procedure: TOTAL KNEE ARTHROPLASTY;  Surgeon: Velna OchsPeter G Dalldorf, MD;  Location: [facility] OR;  Service: Orthopedics;  Laterality: Left;  . TYMPANIC MEMBRANE REPAIR  2001  . VASECTOMY  1982        Home Medications    Prior to Admission medications   Medication Sig Start Date End Date Taking? Authorizing Provider  Alfalfa 650 MG TABS Take 1,300 mg by  mouth daily.    [provider]  aspirin EC 325 MG EC tablet Take 1 tablet (325 mg total) by mouth 2 (two) times daily after a meal. 08/18/13   Elodia FlorenceNida, Andrew, PA-C  cholecalciferol (VITAMIN D) 1000 UNITS tablet Take 1,000 Units by mouth daily.    [provider]  HYDROcodone-acetaminophen (NORCO/VICODIN) 5-325 MG per tablet Take 1-2 tablets by mouth every 6 (six) hours as needed (breakthrough pain). 08/18/13   Elodia FlorenceNida, Andrew, PA-C  methocarbamol (ROBAXIN) 500 MG tablet Take 1 tablet (500 mg total) by mouth every 6 (six) hours as needed for muscle spasms. 08/18/13   Elodia FlorenceNida, Andrew, PA-C  Omega-3 Fatty Acids (OMEGA 3 PO) Take 500 mg by mouth daily.    [provider]  vitamin B-12 (CYANOCOBALAMIN) 500 MCG tablet Take 500 mcg by mouth daily.    [provider]  vitamin C (ASCORBIC ACID) 500 MG tablet Take 500 mg by mouth daily.    [provider]    Family History Family History  Problem Relation Age of Onset  . Anesthesia problems Neg Hx     Social History Social History   Tobacco Use  . Smoking status: Former Smoker    Packs/day: 1.00    Years: 30.00    Pack years:  30.00    Types: Cigarettes    Last attempt to quit: 08/08/2005    Years since quitting: 12.4  . Smokeless tobacco: Never Used  . Tobacco comment: quit smoking 2007  Substance Use Topics  . Alcohol use: Yes    Alcohol/week: 2.0 standard drinks    Types: 2 Shots of liquor per week    Comment: 1-2 beers couple times per month  . Drug use: Yes    Types: Marijuana    Comment: a couple months ago     Allergies   Celebrex [celecoxib]   Review of Systems Review of Systems  Constitutional: Negative for chills and fever.  Musculoskeletal: Positive for arthralgias (Avulsion of the tip of the left middle finger.). Negative for joint swelling.  Skin: Negative for color change.  Neurological: Negative for syncope, weakness and numbness.     Physical Exam Updated Vital Signs BP 101/66  (BP Location: Right Arm)   Pulse 73   Temp 98 F (36.7 C) (Oral)   Resp 18   Ht 5\' 9"  (1.753 m)   Wt 99.8 kg   SpO2 98%   BMI 32.49 kg/m   Physical Exam  Constitutional: He appears well-developed and well-nourished. No distress.  HENT:  Head: Normocephalic and atraumatic.  Cardiovascular: Normal rate, regular rhythm and intact distal pulses.  Pulmonary/Chest: Effort normal and breath sounds normal.  Musculoskeletal: He exhibits tenderness. He exhibits no edema.  Partial avulsion of the distal tip of the left middle finger.  Bleeding controlled.  No obvious bony injury.  Sensation intact.  No edema or FB's.  No involvement of the nail.  Neurological: He is alert. No sensory deficit.  Skin: Skin is warm and dry. Capillary refill takes less than 2 seconds.  Nursing note and vitals reviewed.        ED Treatments / Results  Labs (all labs ordered are listed, but only abnormal results are displayed) Labs Reviewed - No data to display  EKG None  Radiology Dg Finger Middle Left  Result Date: 01/12/2018 CLINICAL DATA:  Avulsion tip of left middle finger EXAM: LEFT MIDDLE FINGER 2+V COMPARISON:  None. FINDINGS: There is a soft tissue defect noted at the tip of the left middle finger. No bony abnormality. No fracture, subluxation or dislocation. Joint spaces maintained. IMPRESSION: Soft tissue defect at the tip of the left middle finger. No bony abnormality. Electronically Signed   By: Charlett Nose M.D.   On: 01/12/2018 19:40    Procedures Procedures (including critical care time)  Medications Ordered in ED Medications  Tdap (BOOSTRIX) injection 0.5 mL (has no administration in time range)  oxyCODONE-acetaminophen (PERCOCET/ROXICET) 5-325 MG per tablet 1 tablet (has no administration in time range)     Initial Impression / Assessment and Plan / ED Course  I have reviewed the triage vital signs and the nursing notes.  Pertinent labs & imaging results that were available  during my care of the patient were reviewed by me and considered in my medical decision making (see chart for details).     2055 consulted Dr. Romeo Apple and discussed findings.  He agrees to see patient in the office.  There is an avulsion of the tip of the finger, nothing to suture.  No involvement of the nail  bleeding is controlled and he is neurovascularly intact. TD updated, wound was cleaned with saline Betadine and Xeroform dressing applied along with finger splint.  Patient agrees to treatment plan and close orthopedic follow-up.   Final Clinical  Impressions(s) / ED Diagnoses   Final diagnoses:  Avulsion of fingertip, initial encounter    ED Discharge Orders    None       Rosey Bath 01/12/18 2103    Eber Hong, MD 01/13/18 [phone]

## 2018-01-12 NOTE — ED Triage Notes (Signed)
PT states he cut the tip of his left hand middle finger off this evening using a wood working tool. Bleeding controlled a this time.

## 2018-01-12 NOTE — Discharge Instructions (Addendum)
Keep the wound clean with mild soap and water and keep it bandaged and splinted.  Call Dr. Mort SawyersHarrison's office tomorrow to arrange a follow-up appointment.

## 2018-01-14 MED FILL — Oxycodone w/ Acetaminophen Tab 5-325 MG: ORAL | Qty: 6 | Status: AC

## 2018-01-18 ENCOUNTER — Encounter: Payer: Self-pay | Admitting: Orthopedic Surgery

## 2018-01-18 ENCOUNTER — Ambulatory Visit (INDEPENDENT_AMBULATORY_CARE_PROVIDER_SITE_OTHER): Payer: Managed Care, Other (non HMO) | Admitting: Orthopedic Surgery

## 2018-01-18 VITALS — BP 133/86 | HR 82 | Ht 70.0 in | Wt 225.0 lb

## 2018-01-18 DIAGNOSIS — S68129A Partial traumatic metacarpophalangeal amputation of unspecified finger, initial encounter: Secondary | ICD-10-CM

## 2018-01-18 DIAGNOSIS — S68119A Complete traumatic metacarpophalangeal amputation of unspecified finger, initial encounter: Secondary | ICD-10-CM

## 2018-01-18 NOTE — Progress Notes (Signed)
NEW PATIENT OFFICE VISIT  Chief Complaint  Patient presents with  . Hand Injury    wood equipment vs left middle fingertip 01/12/18    57 year old male presents for evaluation of fingertip amputation left long finger  Date of injury December 3 Initial treatment in the ER Initially had 7 out of 10 pain now much more comfortable Initial pain was sharp burning Associated with no loss of sensation   Review of Systems  Constitutional: Negative for chills and fever.  Neurological: Negative for tingling.     Past Medical History:  Diagnosis Date  . Arthritis    degenerative joint disease  . Blood transfusion 2009   s/p hip replacement  . Diabetes mellitus without complication (HCC)    dropped 17 lbs. , no meds. yet   . PONV (postoperative nausea and vomiting)     Past Surgical History:  Procedure Laterality Date  . BACK SURGERY  2005   spinal cyst  . JOINT REPLACEMENT  2009   left hip  . TOTAL KNEE ARTHROPLASTY  05/27/2011   Procedure: TOTAL KNEE ARTHROPLASTY;  Surgeon: Velna OchsPeter G Dalldorf, MD;  Location: [facility] OR;  Service: Orthopedics;  Laterality: Right;  DEPUY  . TOTAL KNEE ARTHROPLASTY Left 08/16/2013   Procedure: TOTAL KNEE ARTHROPLASTY;  Surgeon: Velna OchsPeter G Dalldorf, MD;  Location: [facility] OR;  Service: Orthopedics;  Laterality: Left;  . TYMPANIC MEMBRANE REPAIR  2001  . VASECTOMY  1982    Family History  Problem Relation Age of Onset  . Anesthesia problems Neg Hx    Social History   Tobacco Use  . Smoking status: Former Smoker    Packs/day: 1.00    Years: 30.00    Pack years: 30.00    Types: Cigarettes    Last attempt to quit: 08/08/2005    Years since quitting: 12.4  . Smokeless tobacco: Never Used  . Tobacco comment: quit smoking 2007  Substance Use Topics  . Alcohol use: Yes    Alcohol/week: 2.0 standard drinks    Types: 2 Shots of liquor per week    Comment: 1-2 beers couple times per month  . Drug use: Yes    Types: Marijuana    Comment: a couple months ago     Allergies  Allergen Reactions  . Celebrex [Celecoxib] Rash    Current Meds  Medication Sig  . cephALEXin (KEFLEX) 500 MG capsule Take 1 capsule (500 mg total) by mouth 3 (three) times daily.    BP 133/86   Pulse 82   Ht 5\' 10"  (1.778 m)   Wt 225 lb (102.1 kg)   BMI 32.28 kg/m   Physical Exam Normal development grooming and hygiene Alert and oriented x3 Mood is pleasant affect is normal Gait is normal Ortho Exam  Left long finger oblique volar loss of soft tissue approximately with 8 to 10 mm x 12 mm DIP joint range of motion normal stability normal flexor extensor strength normal skin oblique cut as described color capillary refill pulse perfusion normal no sensory deficits no lymphadenopathy no sign of infection  MEDICAL DECISION SECTION  [facility]  My independent reading of xrays:  3 views of the left long finger soft tissue volar oblique amputation with no bone involvement or exposure  Encounter Diagnosis  Name Primary?  . Amputation of tip of finger, initial encounter Yes    PLAN: (Rx., injectx, surgery, frx, mri/ct) Daily dressing changes does not need any antibiotics after today or and when the  once he has runs out  Follow-up in a week for wound check  No orders of the defined types were placed in this encounter.   Fuller Canada, MD  01/18/2018 3:00 PM

## 2018-01-18 NOTE — Patient Instructions (Signed)
Change dressing daily

## 2018-01-21 DIAGNOSIS — S68119A Complete traumatic metacarpophalangeal amputation of unspecified finger, initial encounter: Secondary | ICD-10-CM | POA: Insufficient documentation

## 2018-01-25 ENCOUNTER — Encounter: Payer: Self-pay | Admitting: Orthopedic Surgery

## 2018-01-25 ENCOUNTER — Ambulatory Visit (INDEPENDENT_AMBULATORY_CARE_PROVIDER_SITE_OTHER): Payer: Managed Care, Other (non HMO) | Admitting: Orthopedic Surgery

## 2018-01-25 DIAGNOSIS — S68119D Complete traumatic metacarpophalangeal amputation of unspecified finger, subsequent encounter: Secondary | ICD-10-CM | POA: Diagnosis not present

## 2018-01-25 NOTE — Progress Notes (Signed)
Chief Complaint  Patient presents with  . Hand Pain    fingertip  amputation left middle feels better     [age] year old male left long finger tip injury December 3 volar oblique.  He is doing dressing changes with Neosporin doing well has full range of motion no sign of infection to continue until January 6 for follow-up  Encounter Diagnosis  Name Primary?  . Traumatic amputation of tip of finger, subsequent encounter 01/12/18

## 2018-02-15 ENCOUNTER — Ambulatory Visit (INDEPENDENT_AMBULATORY_CARE_PROVIDER_SITE_OTHER): Payer: Managed Care, Other (non HMO) | Admitting: Orthopedic Surgery

## 2018-02-15 VITALS — BP 128/82 | HR 73 | Ht 70.0 in | Wt 225.0 lb

## 2018-02-15 DIAGNOSIS — S68119D Complete traumatic metacarpophalangeal amputation of unspecified finger, subsequent encounter: Secondary | ICD-10-CM | POA: Diagnosis not present

## 2018-02-15 NOTE — Progress Notes (Signed)
Chief Complaint  Patient presents with  . Follow-up    3 week recheck on wound check of left long finger, DOI 01-12-18.  History of distal fingertip soft tissue amputation  No complaints  ck wound left long finger.  Wound looks fine affect patient has excellent range of motion at the DIP joint no signs of infection patient released  Encounter Diagnosis  Name Primary?  . Traumatic amputation of tip of finger, subsequent encounter 01/12/18 Yes

## 2019-05-05 ENCOUNTER — Ambulatory Visit: Payer: Managed Care, Other (non HMO) | Attending: Internal Medicine

## 2019-05-05 DIAGNOSIS — Z23 Encounter for immunization: Secondary | ICD-10-CM

## 2019-05-05 NOTE — Progress Notes (Signed)
   Covid-19 Vaccination Clinic  Name:  Richard Morris    MRN: [id number] DOB: [birth year]  05/05/2019  Richard Morris was observed post Covid-19 immunization for 15 minutes without incident. He was provided with Vaccine Information Sheet and instruction to access the V-Safe system.   Richard Morris was instructed to call 911 with any severe reactions post vaccine: Marland Kitchen Difficulty breathing  . Swelling of face and throat  . A fast heartbeat  . A bad rash all over body  . Dizziness and weakness   Immunizations Administered    Name Date Dose VIS Date Route   Pfizer COVID-19 Vaccine 05/05/2019  4:29 PM 0.3 mL 01/21/2019 Intramuscular   Manufacturer: [employer], [employer]   Lot: [id number]   NDC: [address]-9

## 2019-05-30 ENCOUNTER — Ambulatory Visit: Payer: Managed Care, Other (non HMO) | Attending: Internal Medicine

## 2019-05-30 DIAGNOSIS — Z23 Encounter for immunization: Secondary | ICD-10-CM

## 2019-05-30 NOTE — Progress Notes (Signed)
   Covid-19 Vaccination Clinic  Name:  Richard Morris    MRN: [id number] DOB: 10-29-1960  05/30/2019  [name] was observed post Covid-19 immunization for 15 minutes without incident. He was provided with Vaccine Information Sheet and instruction to access the V-Safe system.   [name] was instructed to call 911 with any severe reactions post vaccine: Marland Kitchen Difficulty breathing  . Swelling of face and throat  . A fast heartbeat  . A bad rash all over body  . Dizziness and weakness   Immunizations Administered    Name Date Dose VIS Date Route   Pfizer COVID-19 Vaccine 05/30/2019  3:26 PM 0.3 mL 04/06/2018 Intramuscular   Manufacturer: [employer], [employer]   Lot: [id number]   NDC: [address]-3

## 2019-11-08 ENCOUNTER — Other Ambulatory Visit (HOSPITAL_COMMUNITY): Payer: Self-pay | Admitting: Physician Assistant

## 2019-11-08 NOTE — Progress Notes (Signed)
I connected by phone with Richard Morris on 11/08/2019 at 5:04 PM to discuss the potential use of a new treatment for mild to moderate COVID-19 viral infection in non-hospitalized patients.  This patient is a 59 y.o. male that meets the FDA criteria for Emergency Use Authorization of COVID monoclonal antibody casirivimab/imdevimab or bamlanivimab/eteseviamb.  Has a (+) direct SARS-CoV-2 viral test result  Has mild or moderate COVID-19   Is NOT hospitalized due to COVID-19  Is within 10 days of symptom onset  Has at least one of the high risk factor(s) for progression to severe COVID-19 and/or hospitalization as defined in EUA.  Specific high risk criteria : BMI > 25 and Diabetes   I have spoken and communicated the following to the patient or parent/caregiver regarding COVID monoclonal antibody treatment:  1. FDA has authorized the emergency use for the treatment of mild to moderate COVID-19 in adults and pediatric patients with positive results of direct SARS-CoV-2 viral testing who are 80 years of age and older weighing at least 40 kg, and who are at high risk for progressing to severe COVID-19 and/or hospitalization.  2. The significant known and potential risks and benefits of COVID monoclonal antibody, and the extent to which such potential risks and benefits are unknown.  3. Information on available alternative treatments and the risks and benefits of those alternatives, including clinical trials.  4. Patients treated with COVID monoclonal antibody should continue to self-isolate and use infection control measures (e.g., wear mask, isolate, social distance, avoid sharing personal items, clean and disinfect "high touch" surfaces, and frequent handwashing) according to CDC guidelines.   5. The patient or parent/caregiver has the option to accept or refuse COVID monoclonal antibody treatment.  After reviewing this information with the patient, the patient has agreed to receive one of  the available covid 19 monoclonal antibodies and will be provided an appropriate fact sheet prior to infusion.  Scheduled 11/09/2019 Jodell Cipro, PA-C 11/08/2019 5:04 PM

## 2019-11-09 ENCOUNTER — Other Ambulatory Visit (HOSPITAL_COMMUNITY): Payer: Self-pay

## 2019-11-09 ENCOUNTER — Ambulatory Visit (HOSPITAL_COMMUNITY)
Admission: RE | Admit: 2019-11-09 | Discharge: 2019-11-09 | Disposition: A | Discharge status: Home / Self Care | Payer: Managed Care, Other (non HMO) | Source: Ambulatory Visit | Attending: Pulmonary Disease | Admitting: Pulmonary Disease

## 2019-11-09 DIAGNOSIS — U071 COVID-19: Secondary | ICD-10-CM | POA: Diagnosis not present

## 2019-11-09 DIAGNOSIS — E119 Type 2 diabetes mellitus without complications: Secondary | ICD-10-CM | POA: Diagnosis not present

## 2019-11-09 MED ORDER — FAMOTIDINE IN NACL 20-0.9 MG/50ML-% IV SOLN: 20.0000 mg | Freq: Once | INTRAVENOUS | Status: DC | PRN

## 2019-11-09 MED ORDER — METHYLPREDNISOLONE SODIUM SUCC 125 MG IJ SOLR: 125.0000 mg | Freq: Once | INTRAMUSCULAR | Status: DC | PRN

## 2019-11-09 MED ORDER — DIPHENHYDRAMINE HCL 50 MG/ML IJ SOLN: 50.0000 mg | Freq: Once | INTRAMUSCULAR | Status: DC | PRN

## 2019-11-09 MED ORDER — SODIUM CHLORIDE 0.9 % IV SOLN
INTRAVENOUS | Status: DC | PRN
Start: 2019-11-09 — End: 2019-11-10

## 2019-11-09 MED ORDER — EPINEPHRINE 0.3 MG/0.3ML IJ SOAJ: 0.3000 mg | Freq: Once | INTRAMUSCULAR | Status: DC | PRN

## 2019-11-09 MED ORDER — SODIUM CHLORIDE 0.9 % IV SOLN
1200.0000 mg | Freq: Once | INTRAVENOUS | Status: AC
  Administered 2019-11-09: 1200 mg via INTRAVENOUS

## 2019-11-09 MED ORDER — ALBUTEROL SULFATE HFA 108 (90 BASE) MCG/ACT IN AERS: 2.0000 | INHALATION_SPRAY | Freq: Once | RESPIRATORY_TRACT | Status: DC | PRN

## 2019-11-09 NOTE — Discharge Instructions (Signed)

## 2019-11-09 NOTE — Progress Notes (Signed)
  Diagnosis: COVID-19  Physician: Dr. Wright  Procedure: Covid Infusion Clinic Med: casirivimab\imdevimab infusion - Provided patient with casirivimab\imdevimab fact sheet for patients, parents and caregivers prior to infusion.  Complications: No immediate complications noted.  Discharge: Discharged home   Idamae Coccia M Yaniv Lage 11/09/2019  

## 2019-11-19 NOTE — Progress Notes (Signed)
State Department of Health notified of results per regulations      Patient was reached and results were delivered.

## 2020-10-03 ENCOUNTER — Ambulatory Visit
Admission: RE | Admit: 2020-10-03 | Discharge: 2020-10-03 | Disposition: A | Discharge status: Home / Self Care | Payer: Managed Care, Other (non HMO) | Source: Ambulatory Visit | Attending: Physician Assistant | Admitting: Physician Assistant

## 2020-10-03 ENCOUNTER — Other Ambulatory Visit: Payer: Self-pay

## 2020-10-03 ENCOUNTER — Other Ambulatory Visit: Payer: Self-pay | Admitting: Physician Assistant

## 2020-10-03 DIAGNOSIS — M25551 Pain in right hip: Secondary | ICD-10-CM

## 2020-10-03 MED ORDER — METHYLPREDNISOLONE ACETATE 40 MG/ML INJ SUSP (RADIOLOG
80.0000 mg | Freq: Once | INTRAMUSCULAR | Status: AC
  Administered 2020-10-03: 80 mg via INTRA_ARTICULAR

## 2020-10-03 MED ORDER — IOPAMIDOL (ISOVUE-M 200) INJECTION 41%
1.0000 mL | Freq: Once | INTRAMUSCULAR | Status: AC
  Administered 2020-10-03: 1 mL via INTRA_ARTICULAR

## 2021-02-16 ENCOUNTER — Ambulatory Visit
Admission: EM | Admit: 2021-02-16 | Discharge: 2021-02-16 | Disposition: A | Discharge status: Home / Self Care | Payer: Managed Care, Other (non HMO)

## 2021-02-16 ENCOUNTER — Other Ambulatory Visit: Payer: Self-pay

## 2021-02-16 ENCOUNTER — Ambulatory Visit (INDEPENDENT_AMBULATORY_CARE_PROVIDER_SITE_OTHER): Payer: Self-pay

## 2021-02-16 DIAGNOSIS — S62631A Displaced fracture of distal phalanx of left index finger, initial encounter for closed fracture: Secondary | ICD-10-CM

## 2021-02-16 DIAGNOSIS — M79645 Pain in left finger(s): Secondary | ICD-10-CM

## 2021-02-16 DIAGNOSIS — S6992XA Unspecified injury of left wrist, hand and finger(s), initial encounter: Secondary | ICD-10-CM

## 2021-02-16 DIAGNOSIS — S62631B Displaced fracture of distal phalanx of left index finger, initial encounter for open fracture: Secondary | ICD-10-CM

## 2021-02-16 DIAGNOSIS — T148XXA Other injury of unspecified body region, initial encounter: Secondary | ICD-10-CM

## 2021-02-16 MED ORDER — CEPHALEXIN 500 MG PO CAPS: 500.0000 mg | ORAL_CAPSULE | Freq: Three times a day (TID) | ORAL | 0 refills | Status: AC

## 2021-02-16 NOTE — Discharge Instructions (Signed)
Please make sure that you wear the finger splint as much as possible.  It is okay to remove it to wash your hand and keep the area from introducing more infection.  For now, take Keflex 3 times a day to address this as an open fracture.  Make sure you follow-up with hand specialist through [facility].

## 2021-02-16 NOTE — ED Notes (Signed)
Pt reports last Tdap was on November 2022.

## 2021-02-16 NOTE — ED Triage Notes (Signed)
Pt presents with laceration in left index finger. States last night a piece of wood cut his left index finger.

## 2021-02-16 NOTE — ED Provider Notes (Signed)
[facility]   MRN: [id number] DOB: 21-May-1960  Subjective:   Richard Morris is a 61 y.o. male presenting for suffering a left index finger injury about 19 hours ago.  Patient had a piece of wood that was thrown out of the machine into his left index finger.  This caused his finger to split, break in the nail.  At the time he did not have much pain, cleaned his wound and wrapped it.  He did not want to go to the emergency room last night so he came in today.  Denies fever, nausea, vomiting, drainage of pus or bleeding.  Tdap is up-to-date, had it done in 2019.  No current facility-administered medications for this encounter.  Current Outpatient Medications:    Dulaglutide (TRULICITY) 1.5 MG/0.5ML SOPN, Inject into the skin., Disp: , Rfl:    Alfalfa 650 MG TABS, Take 1,300 mg by mouth daily., Disp: , Rfl:    aspirin EC 325 MG EC tablet, Take 1 tablet (325 mg total) by mouth 2 (two) times daily after a meal. (Patient not taking: Reported on 01/18/2018), Disp: 30 tablet, Rfl: 0   cholecalciferol (VITAMIN D) 1000 UNITS tablet, Take 1,000 Units by mouth daily., Disp: , Rfl:    Omega-3 Fatty Acids (OMEGA 3 PO), Take 500 mg by mouth daily., Disp: , Rfl:    vitamin B-12 (CYANOCOBALAMIN) 500 MCG tablet, Take 500 mcg by mouth daily., Disp: , Rfl:    vitamin C (ASCORBIC ACID) 500 MG tablet, Take 500 mg by mouth daily., Disp: , Rfl:    Allergies  Allergen Reactions   Celebrex [Celecoxib] Rash    Past Medical History:  Diagnosis Date   Arthritis    degenerative joint disease   Blood transfusion 2009   s/p hip replacement   Diabetes mellitus without complication (HCC)    dropped 17 lbs. , no meds. yet    PONV (postoperative nausea and vomiting)      Past Surgical History:  Procedure Laterality Date   BACK SURGERY  2005   spinal cyst   JOINT REPLACEMENT  2009   left hip   TOTAL KNEE ARTHROPLASTY  05/27/2011   Procedure: TOTAL KNEE ARTHROPLASTY;  Surgeon: Velna Ochs,  MD;  Location: [facility] OR;  Service: Orthopedics;  Laterality: Right;  DEPUY   TOTAL KNEE ARTHROPLASTY Left 08/16/2013   Procedure: TOTAL KNEE ARTHROPLASTY;  Surgeon: Velna Ochs, MD;  Location: [facility] OR;  Service: Orthopedics;  Laterality: Left;   TYMPANIC MEMBRANE REPAIR  2001   VASECTOMY  1982    Family History  Problem Relation Age of Onset   Anesthesia problems Neg Hx     Social History   Tobacco Use   Smoking status: Former    Packs/day: 1.00    Years: 30.00    Pack years: 30.00    Types: Cigarettes    Quit date: 08/08/2005    Years since quitting: 15.5   Smokeless tobacco: Never   Tobacco comments:    quit smoking 2007  Vaping Use   Vaping Use: Never used  Substance Use Topics   Alcohol use: Yes    Alcohol/week: 2.0 standard drinks    Types: 2 Shots of liquor per week    Comment: 1-2 beers couple times per month   Drug use: Yes    Types: Marijuana    Comment: a couple months ago    ROS   Objective:   Vitals: BP 138/79 (BP Location: Right Arm)    Pulse  85    Temp 98.1 F (36.7 C) (Oral)    Resp 18    SpO2 97%   Physical Exam Constitutional:      General: He is not in acute distress.    Appearance: Normal appearance. He is well-developed and normal weight. He is not ill-appearing, toxic-appearing or diaphoretic.  HENT:     Head: Normocephalic and atraumatic.     Right Ear: External ear normal.     Left Ear: External ear normal.     Nose: Nose normal.     Mouth/Throat:     Pharynx: Oropharynx is clear.  Eyes:     General: No scleral icterus.       Right eye: No discharge.        Left eye: No discharge.     Extraocular Movements: Extraocular movements intact.  Cardiovascular:     Rate and Rhythm: Normal rate.  Pulmonary:     Effort: Pulmonary effort is normal.  Musculoskeletal:       Hands:     Cervical back: Normal range of motion.     Comments: Patient has near full range of motion at the DIP of the left index finger.  Sensation is intact.  Brisk  capillary refill.  Neurological:     Mental Status: He is alert and oriented to person, place, and time.  Psychiatric:        Mood and Affect: Mood normal.        Behavior: Behavior normal.        Thought Content: Thought content normal.        Judgment: Judgment normal.    DG Finger Index Left  Result Date: 02/16/2021 CLINICAL DATA:  Laceration to left index finger EXAM: LEFT INDEX FINGER 2+V COMPARISON:  None. FINDINGS: Minimally displaced fracture of the lateral (thenar) aspect of the tuft of the left second distal phalanx. No other fracture or dislocation. Joint spaces are preserved. Linear metallic foreign body projects over the webbing of the first second metacarpal interspace or thenar eminence, no greater than 3 mm in projection. Soft tissue edema about the left index digit. IMPRESSION: 1. Minimally displaced fracture of the lateral (thenar) aspect of the tuft of the left second distal phalanx. No other fracture or dislocation. 2. Linear metallic foreign body projects over the webbing of the first second metacarpal interspace or thenar eminence, no greater than 3 mm in projection. This may be incidental or superficial, not well localized by these radiographs. Electronically Signed   By: Jearld LeschAlex D Bibbey M.D.   On: 02/16/2021 13:50    A dressing was applied to the left index finger.  Metallic finger splint was placed over the and secured with Coban.  Assessment and Plan :   PDMP not reviewed this encounter.  1. Finger pain, left   2. Closed displaced fracture of distal phalanx of left index finger, initial encounter   3. Fingernail injury, left, initial encounter    Given age of the wound, it is not amenable to laceration repair.  As it is it is actually well approximated.  He has an open fracture and therefore needs Keflex.  Tdap is up-to-date.  Patient is to wear the finger splint is much as possible removing it only to wash his hand or bathe.  Needs to follow-up as soon as possible with  a hand specialist, provided him with information to [facility]. Counseled patient on potential for adverse effects with medications prescribed/recommended today, ER and return-to-clinic precautions discussed, patient verbalized  understanding.    Wallis Bamberg, [place] 02/16/21 1415

## 2021-07-17 ENCOUNTER — Other Ambulatory Visit: Payer: Self-pay | Admitting: Urology

## 2021-07-17 ENCOUNTER — Other Ambulatory Visit (HOSPITAL_COMMUNITY): Payer: Self-pay | Admitting: Urology

## 2021-07-17 DIAGNOSIS — R972 Elevated prostate specific antigen [PSA]: Secondary | ICD-10-CM

## 2021-08-06 ENCOUNTER — Ambulatory Visit (HOSPITAL_COMMUNITY)
Admission: RE | Admit: 2021-08-06 | Discharge: 2021-08-06 | Disposition: A | Discharge status: Home / Self Care | Payer: 59 | Source: Ambulatory Visit | Attending: Urology | Admitting: Urology

## 2021-08-06 DIAGNOSIS — R972 Elevated prostate specific antigen [PSA]: Secondary | ICD-10-CM | POA: Diagnosis present

## 2021-08-06 MED ORDER — GADOBUTROL 1 MMOL/ML IV SOLN
10.0000 mL | Freq: Once | INTRAVENOUS | Status: AC | PRN
  Administered 2021-08-06: 10 mL via INTRAVENOUS

## 2023-03-16 IMAGING — XA DG FLUORO GUIDE NDL PLC/BX
1 series · 1 of 1 positions shown · non-contrast
Comparison: none

CLINICAL DATA: Right pain.

[Series 2: ortho standard · 1 of 1 slices shown]
[im 1/1]
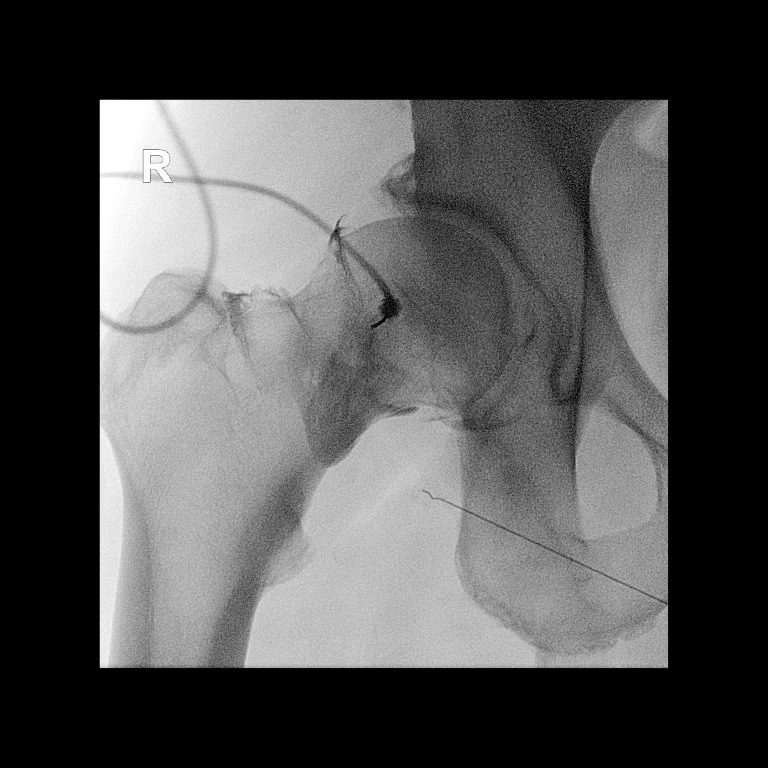

[1 of 1 positions shown; findings below may reference images not displayed]

FLUOROSCOPY TIME:  Radiation Exposure Index (as provided by the
fluoroscopic device): 33.72 uGy*m2

PROCEDURE:
Following informed, written consent, the patient was positioned
supine and the right hip was located under fluoroscopy. The skin was
prepped and draped in the usual sterile fashion. The superficial
soft tissues were anesthetized with 1% lidocaine. A 20 gauge needle
was advanced into the joint. Placement was confirmed with 1.5 ml of
Isovue M 200.

I then injected 80 mg Depo-Medrol and 2 mL 0.5% bupivacaine. The
patient tolerated the procedure without immediate complication.
IMPRESSION: Technically successful right hip steroid injection.

## 2023-07-30 IMAGING — DX DG FINGER INDEX 2+V*L*
3 series · 3 of 3 positions shown · non-contrast
Comparison: None.

CLINICAL DATA: Laceration to left index finger

EXAM:
LEFT INDEX FINGER 2+V

[finger pa]
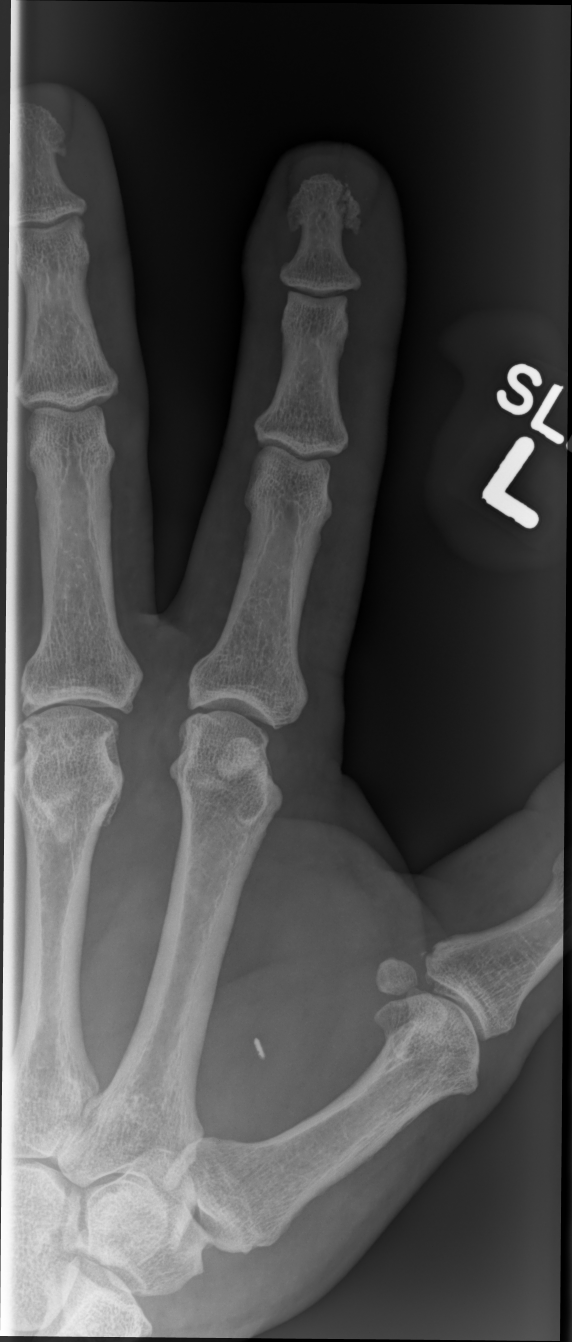

[finger mlo]
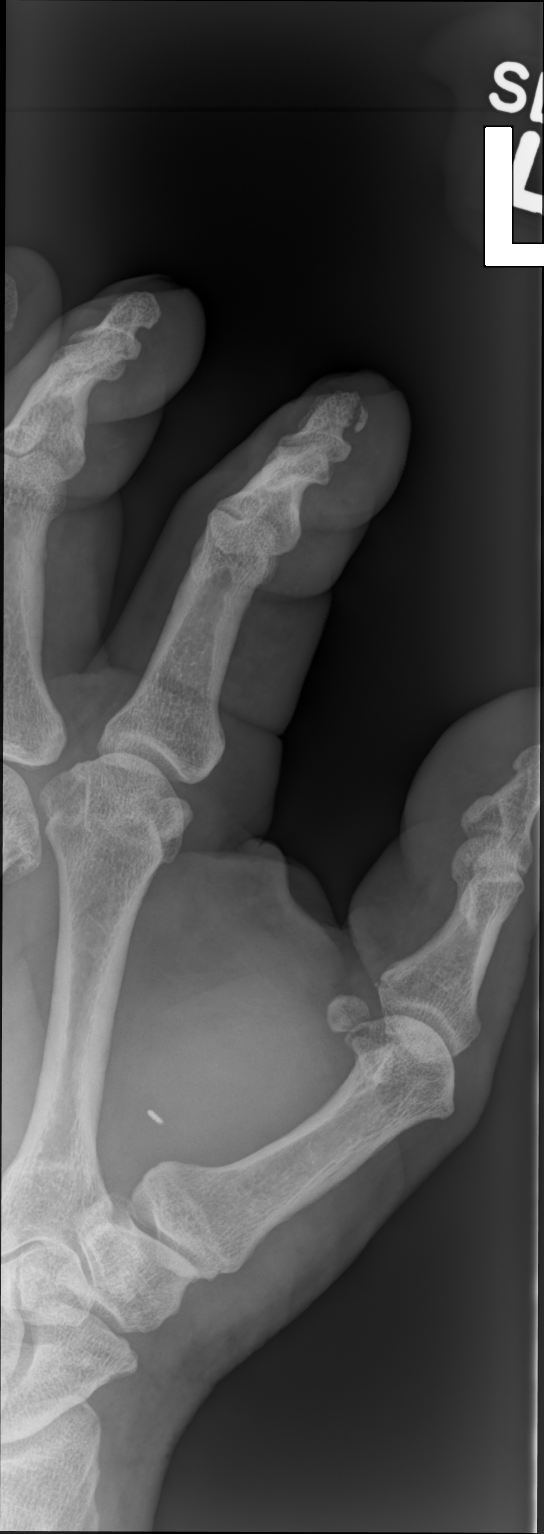

[2. finger lat]
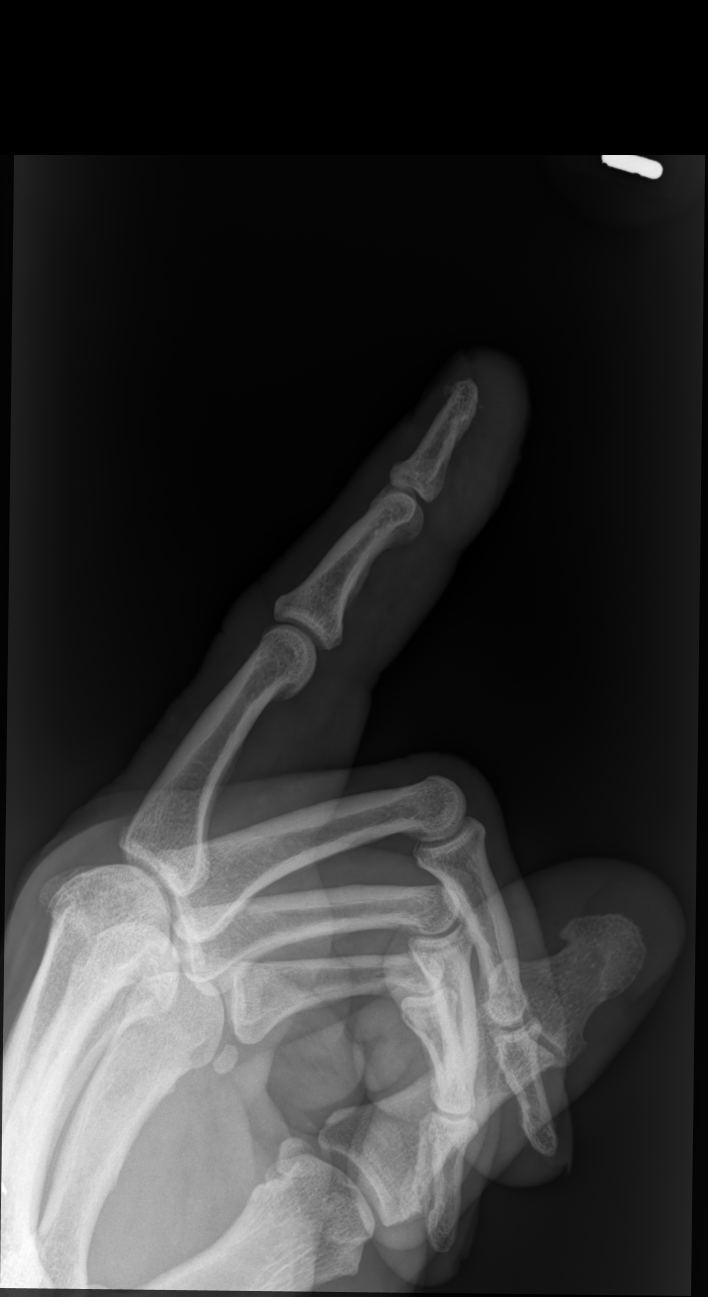

[3 of 3 positions shown; findings below may reference images not displayed]

FINDINGS: Minimally displaced fracture of the lateral (thenar) aspect of the
tuft of the left second distal phalanx. No other fracture or
dislocation. Joint spaces are preserved. Linear metallic foreign
body projects over the webbing of the first second metacarpal
interspace or thenar eminence, no greater than 3 mm in projection.
Soft tissue edema about the left index digit.
IMPRESSION: 1. Minimally displaced fracture of the lateral (thenar) aspect of
the tuft of the left second distal phalanx. No other fracture or
dislocation.
2. Linear metallic foreign body projects over the webbing of the
first second metacarpal interspace or thenar eminence, no greater
than 3 mm in projection. This may be incidental or superficial, not
well localized by these radiographs.

## 2023-08-16 ENCOUNTER — Emergency Department (HOSPITAL_COMMUNITY)

## 2023-08-16 ENCOUNTER — Emergency Department (HOSPITAL_COMMUNITY)
Admission: EM | Admit: 2023-08-16 | Discharge: 2023-08-16 | Disposition: A | Discharge status: Home / Self Care | Attending: Emergency Medicine | Admitting: Emergency Medicine

## 2023-08-16 ENCOUNTER — Other Ambulatory Visit: Payer: Self-pay

## 2023-08-16 DIAGNOSIS — N400 Enlarged prostate without lower urinary tract symptoms: Secondary | ICD-10-CM | POA: Diagnosis not present

## 2023-08-16 DIAGNOSIS — I2510 Atherosclerotic heart disease of native coronary artery without angina pectoris: Secondary | ICD-10-CM | POA: Diagnosis not present

## 2023-08-16 DIAGNOSIS — Z7982 Long term (current) use of aspirin: Secondary | ICD-10-CM | POA: Insufficient documentation

## 2023-08-16 DIAGNOSIS — R0789 Other chest pain: Secondary | ICD-10-CM | POA: Insufficient documentation

## 2023-08-16 DIAGNOSIS — R079 Chest pain, unspecified: Secondary | ICD-10-CM | POA: Diagnosis not present

## 2023-08-16 LAB — CBC WITH DIFFERENTIAL/PLATELET
Abs Immature Granulocytes: 0.03 K/uL (ref 0.00–0.07)
Basophils Absolute: 0.1 K/uL (ref 0.0–0.1)
Basophils Relative: 1 %
Eosinophils Absolute: 0.4 K/uL (ref 0.0–0.5)
Eosinophils Relative: 5 %
HCT: 43.5 % (ref 39.0–52.0)
Hemoglobin: 14.2 g/dL (ref 13.0–17.0)
Immature Granulocytes: 0 %
Lymphocytes Relative: 31 %
Lymphs Abs: 2.5 K/uL (ref 0.7–4.0)
MCH: 29.3 pg (ref 26.0–34.0)
MCHC: 32.6 g/dL (ref 30.0–36.0)
MCV: 89.9 fL (ref 80.0–100.0)
Monocytes Absolute: 0.8 K/uL (ref 0.1–1.0)
Monocytes Relative: 10 %
Neutro Abs: 4.2 K/uL (ref 1.7–7.7)
Neutrophils Relative %: 53 %
Platelets: 271 K/uL (ref 150–400)
RBC: 4.84 MIL/uL (ref 4.22–5.81)
RDW: 13.4 % (ref 11.5–15.5)
WBC: 7.9 K/uL (ref 4.0–10.5)
nRBC: 0 % (ref 0.0–0.2)

## 2023-08-16 LAB — COMPREHENSIVE METABOLIC PANEL WITH GFR
ALT: 25 U/L (ref 0–44)
AST: 22 U/L (ref 15–41)
Albumin: 4.7 g/dL (ref 3.5–5.0)
Alkaline Phosphatase: 85 U/L (ref 38–126)
Anion gap: 11 (ref 5–15)
BUN: 18 mg/dL (ref 8–23)
CO2: 27 mmol/L (ref 22–32)
Calcium: 9.6 mg/dL (ref 8.9–10.3)
Chloride: 101 mmol/L (ref 98–111)
Creatinine, Ser: 0.83 mg/dL (ref 0.61–1.24)
GFR, Estimated: >60 mL/min (ref >60)
Glucose, Bld: 129 mg/dL — ABNORMAL HIGH (ref 70–99)
Potassium: 4 mmol/L (ref 3.5–5.1)
Sodium: 139 mmol/L (ref 135–145)
Total Bilirubin: 0.8 mg/dL (ref 0.0–1.2)
Total Protein: 8 g/dL (ref 6.5–8.1)

## 2023-08-16 LAB — TROPONIN I (HIGH SENSITIVITY)
Troponin I (High Sensitivity): 2 ng/L (ref <18)
Troponin I (High Sensitivity): 2 ng/L (ref <18)

## 2023-08-16 MED ORDER — IOHEXOL 350 MG/ML SOLN
100.0000 mL | Freq: Once | INTRAVENOUS | Status: AC | PRN
  Administered 2023-08-16: 100 mL via INTRAVENOUS

## 2023-08-16 NOTE — ED Provider Notes (Signed)
 [place] EMERGENCY DEPARTMENT AT [facility] Provider Note   CSN: [id number] Arrival date & time: 08/16/23  9089     Patient presents with: Chest Pain   Richard Morris is a 63 y.o. male.   Patient presents with right-sided chest pain and right chest pressure.  No sweating no shortness of breath no fever or chills  The history is provided by the patient and medical records. No language interpreter was used.  Chest Pain Pain location:  R chest Pain quality: aching and pressure   Pain radiates to:  Does not radiate Pain severity:  Moderate Onset quality:  Sudden Timing:  Constant Progression:  Worsening Chronicity:  New Context: not breathing   Relieved by:  Nothing Worsened by:  Nothing Associated symptoms: no abdominal pain, no back pain, no cough, no fatigue and no headache        Prior to Admission medications   Medication Sig Start Date End Date Taking? Authorizing Provider  Alfalfa 650 MG TABS Take 1,300 mg by mouth daily.    [provider]  aspirin  EC 325 MG EC tablet Take 1 tablet (325 mg total) by mouth 2 (two) times daily after a meal. Patient not taking: Reported on 01/18/2018 08/18/13   Lenis Barter, PA-C  cephALEXin  (KEFLEX ) 500 MG capsule Take 1 capsule (500 mg total) by mouth 3 (three) times daily. 02/16/21   Christopher Savannah, PA-C  cholecalciferol (VITAMIN D) 1000 UNITS tablet Take 1,000 Units by mouth daily.    [provider]  Dulaglutide (TRULICITY) 1.5 MG/0.5ML SOPN Inject into the skin.    [provider]  Omega-3 Fatty Acids (OMEGA 3 PO) Take 500 mg by mouth daily.    [provider]  vitamin B-12 (CYANOCOBALAMIN) 500 MCG tablet Take 500 mcg by mouth daily.    [provider]  vitamin C (ASCORBIC ACID) 500 MG tablet Take 500 mg by mouth daily.    [provider]    Allergies: Celebrex [celecoxib]    Review of Systems  Constitutional:  Negative for appetite change and fatigue.  HENT:   Negative for congestion, ear discharge and sinus pressure.   Eyes:  Negative for discharge.  Respiratory:  Negative for cough.   Cardiovascular:  Positive for chest pain.  Gastrointestinal:  Negative for abdominal pain and diarrhea.  Genitourinary:  Negative for frequency and hematuria.  Musculoskeletal:  Negative for back pain.  Skin:  Negative for rash.  Neurological:  Negative for seizures and headaches.  Psychiatric/Behavioral:  Negative for hallucinations.     Updated Vital Signs BP (!) 146/81 (BP Location: Right Arm)   Pulse 66   Temp 98 F (36.7 C) (Oral)   Resp 18   SpO2 98%   Physical Exam Vitals and nursing note reviewed.  Constitutional:      Appearance: He is well-developed.  HENT:     Head: Normocephalic.     Nose: Nose normal.  Eyes:     General: No scleral icterus.    Conjunctiva/sclera: Conjunctivae normal.  Neck:     Thyroid: No thyromegaly.  Cardiovascular:     Rate and Rhythm: Normal rate and regular rhythm.     Heart sounds: No murmur heard.    No friction rub. No gallop.  Pulmonary:     Breath sounds: No stridor. No wheezing or rales.  Chest:     Chest wall: No tenderness.  Abdominal:     General: There is no distension.     Tenderness: There  is no abdominal tenderness. There is no rebound.  Musculoskeletal:        General: Normal range of motion.     Cervical back: Neck supple.  Lymphadenopathy:     Cervical: No cervical adenopathy.  Skin:    Findings: No erythema or rash.  Neurological:     Mental Status: He is alert and oriented to person, place, and time.     Motor: No abnormal muscle tone.     Coordination: Coordination normal.  Psychiatric:        Behavior: Behavior normal.     (all labs ordered are listed, but only abnormal results are displayed) Labs Reviewed  COMPREHENSIVE METABOLIC PANEL WITH GFR - Abnormal; Notable for the following components:      Result Value   Glucose, Bld 129 (*)    All other components within normal  limits  CBC WITH DIFFERENTIAL/PLATELET  TROPONIN I (HIGH SENSITIVITY)  TROPONIN I (HIGH SENSITIVITY)    EKG: EKG Interpretation Date/Time:  Sunday August 16 2023 09:22:23 EDT Ventricular Rate:  58 PR Interval:  159 QRS Duration:  92 QT Interval:  404 QTC Calculation: 397 R Axis:   52  Text Interpretation: Sinus rhythm Confirmed by Suzette Pac [phone]) on 08/16/2023 11:29:34 AM  Radiology: CT Angio Chest/Abd/Pel for Dissection W and/or Wo Contrast Result Date: 08/16/2023 CLINICAL DATA:  Right-sided chest pain with right shoulder and upper back pain for several days EXAM: CT ANGIOGRAPHY CHEST, ABDOMEN AND PELVIS TECHNIQUE: Non-contrast CT of the chest was initially obtained. Multidetector CT imaging through the chest, abdomen and pelvis was performed using the standard protocol during bolus administration of intravenous contrast. Multiplanar reconstructed images and MIPs were obtained and reviewed to evaluate the vascular anatomy. RADIATION DOSE REDUCTION: This exam was performed according to the departmental dose-optimization program which includes automated exposure control, adjustment of the mA and/or kV according to patient size and/or use of iterative reconstruction technique. CONTRAST:  OMNIPAQUE  IOHEXOL  350 MG/ML SOLN COMPARISON:  08/16/2023 FINDINGS: CTA CHEST FINDINGS Cardiovascular: The thoracic aorta is unremarkable with no evidence of aneurysm or dissection. The heart is unremarkable without pericardial effusion. Atherosclerosis of the coronary vasculature greatest in the LAD and right coronary distributions. There is technically adequate opacification of the pulmonary vasculature. No filling defects or pulmonary emboli. Mediastinum/Nodes: No enlarged mediastinal, hilar, or axillary lymph nodes. Thyroid gland, trachea, and esophagus demonstrate no significant findings. Lungs/Pleura: No acute airspace disease, effusion, or pneumothorax. The central airways are patent. Musculoskeletal:  There are no acute or destructive bony abnormalities. Multilevel thoracic spondylosis, most significant at the T2-3 level with severe bony encroachment upon the neural foramina bilaterally and symmetrically. Prior T9 and T10 laminectomy. Reconstructed images demonstrate no additional findings. Review of the MIP images confirms the above findings. CTA ABDOMEN AND PELVIS FINDINGS VASCULAR Aorta: Normal caliber aorta without aneurysm, dissection, vasculitis or significant stenosis. Mild atherosclerosis. Celiac: Patent without evidence of aneurysm, dissection, vasculitis or significant stenosis. 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 SMA: Patent without evidence of aneurysm, dissection, vasculitis or significant stenosis. 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 Renals: Both renal arteries are patent without evidence of aneurysm, dissection, vasculitis, fibromuscular dysplasia or significant stenosis. IMA: Patent without evidence of aneurysm, dissection, vasculitis or significant stenosis. Inflow: Patent without evidence of aneurysm, dissection, vasculitis or significant stenosis. Veins: No obvious venous abnormality within the limitations of this arterial phase study. Review of the MIP images confirms the  above findings. NON-VASCULAR Hepatobiliary: No focal liver abnormality is seen. No gallstones, gallbladder wall thickening, or biliary dilatation. Pancreas: Unremarkable. No pancreatic ductal dilatation or surrounding inflammatory changes. Spleen: Normal in size without focal abnormality. Adrenals/Urinary Tract: Adrenal glands are unremarkable. Kidneys are normal, without renal calculi, focal lesion, or hydronephrosis. Bladder is unremarkable. Stomach/Bowel: No bowel obstruction or ileus. Normal appendix right lower quadrant. No bowel wall thickening or inflammatory change. Lymphatic: No pathologic adenopathy. Reproductive: Enlarged prostate  measuring 6.3 x 4.6 cm. Other: No free fluid or free intraperitoneal gas. No abdominal wall hernia. Musculoskeletal: No acute or destructive bony abnormalities. Bilateral hip arthroplasties without evidence of acute complication. Reconstructed images demonstrate no additional findings. Review of the MIP images confirms the above findings. IMPRESSION: Vascular: 1. No evidence of thoracoabdominal aortic aneurysm or dissection. 2. No evidence of pulmonary embolus. 3. Aortic Atherosclerosis (ICD10-I70.0). Coronary artery atherosclerosis. Nonvascular: 1. No acute intrathoracic, intra-abdominal, or intrapelvic process. 2. Enlarged prostate. Electronically Signed   By: Ozell Daring M.D.   On: 08/16/2023 11:12   DG Chest Port 1 View Result Date: 08/16/2023 CLINICAL DATA:  Pain EXAM: PORTABLE CHEST 1 VIEW COMPARISON:  None Available. FINDINGS: Normal mediastinum and cardiac silhouette. Normal pulmonary vasculature. No evidence of effusion, infiltrate, or pneumothorax. No acute bony abnormality. IMPRESSION: No acute cardiopulmonary process. Electronically Signed   By: Jackquline Boxer M.D.   On: 08/16/2023 10:23     Procedures   Medications Ordered in the ED  iohexol  (OMNIPAQUE ) 350 MG/ML injection 100 mL (100 mLs Intravenous Contrast Given 08/16/23 1046)   CT angio chest and abdomen unremarkable except for large prostate troponins were negative.                                 Medical Decision Making Amount and/or Complexity of Data Reviewed Labs: ordered. Radiology: ordered.  Risk Prescription drug management.  Patient with atypical chest pain.  He is referred to cardiology for possible stress test as an outpatient     Final diagnoses:  Atypical chest pain    ED Discharge Orders          Ordered    Ambulatory referral to Cardiology       Comments: If you have not heard from the Cardiology office within the next 72 hours please call [phone].   08/16/23 1444                Suzette Pac, MD 08/16/23 1655

## 2023-08-16 NOTE — ED Triage Notes (Signed)
 Pt c/o R sided CP and R sided shoulder pain as well as R upper back pain x couple of days, denies any recent injury to the area, states the pain feels like a toothache. Rates pain 4/10. Denies shob

## 2023-08-16 NOTE — Discharge Instructions (Signed)
 Take Tylenol  for pain and follow-up with your family doctor.  You also should follow-up with the cardiologist

## 2023-09-14 DIAGNOSIS — N400 Enlarged prostate without lower urinary tract symptoms: Secondary | ICD-10-CM | POA: Diagnosis not present

## 2023-09-21 ENCOUNTER — Other Ambulatory Visit: Payer: Self-pay | Admitting: Urology

## 2023-09-21 DIAGNOSIS — R972 Elevated prostate specific antigen [PSA]: Secondary | ICD-10-CM

## 2023-09-21 DIAGNOSIS — Z8042 Family history of malignant neoplasm of prostate: Secondary | ICD-10-CM | POA: Diagnosis not present

## 2023-09-21 DIAGNOSIS — N400 Enlarged prostate without lower urinary tract symptoms: Secondary | ICD-10-CM | POA: Diagnosis not present

## 2023-09-23 ENCOUNTER — Encounter: Payer: Self-pay | Admitting: Urology

## 2023-09-24 ENCOUNTER — Other Ambulatory Visit (HOSPITAL_COMMUNITY): Payer: Self-pay | Admitting: Urology

## 2023-09-24 DIAGNOSIS — R972 Elevated prostate specific antigen [PSA]: Secondary | ICD-10-CM

## 2023-10-02 ENCOUNTER — Ambulatory Visit (HOSPITAL_COMMUNITY)
Admission: RE | Admit: 2023-10-02 | Discharge: 2023-10-02 | Disposition: A | Discharge status: Home / Self Care | Source: Ambulatory Visit | Attending: Urology | Admitting: Urology

## 2023-10-02 DIAGNOSIS — R972 Elevated prostate specific antigen [PSA]: Secondary | ICD-10-CM | POA: Insufficient documentation

## 2023-10-02 DIAGNOSIS — N400 Enlarged prostate without lower urinary tract symptoms: Secondary | ICD-10-CM | POA: Diagnosis not present

## 2023-10-02 DIAGNOSIS — N4289 Other specified disorders of prostate: Secondary | ICD-10-CM | POA: Diagnosis not present

## 2023-10-02 MED ORDER — GADOBUTROL 1 MMOL/ML IV SOLN
10.0000 mL | Freq: Once | INTRAVENOUS | Status: AC | PRN
  Administered 2023-10-02: 10 mL via INTRAVENOUS

## 2023-10-27 DIAGNOSIS — E119 Type 2 diabetes mellitus without complications: Secondary | ICD-10-CM | POA: Diagnosis not present

## 2023-10-27 DIAGNOSIS — R972 Elevated prostate specific antigen [PSA]: Secondary | ICD-10-CM | POA: Diagnosis not present

## 2023-10-27 DIAGNOSIS — E785 Hyperlipidemia, unspecified: Secondary | ICD-10-CM | POA: Diagnosis not present

## 2023-10-27 DIAGNOSIS — Z0000 Encounter for general adult medical examination without abnormal findings: Secondary | ICD-10-CM | POA: Diagnosis not present

## 2023-10-27 DIAGNOSIS — G4733 Obstructive sleep apnea (adult) (pediatric): Secondary | ICD-10-CM | POA: Diagnosis not present

## 2023-10-29 ENCOUNTER — Other Ambulatory Visit: Payer: Self-pay | Admitting: Student

## 2023-10-29 DIAGNOSIS — Z87891 Personal history of nicotine dependence: Secondary | ICD-10-CM

## 2023-11-10 DIAGNOSIS — R972 Elevated prostate specific antigen [PSA]: Secondary | ICD-10-CM | POA: Diagnosis not present

## 2023-11-10 DIAGNOSIS — D075 Carcinoma in situ of prostate: Secondary | ICD-10-CM | POA: Diagnosis not present

## 2023-11-16 DIAGNOSIS — R972 Elevated prostate specific antigen [PSA]: Secondary | ICD-10-CM | POA: Diagnosis not present

## 2023-11-16 DIAGNOSIS — R3912 Poor urinary stream: Secondary | ICD-10-CM | POA: Diagnosis not present

## 2023-11-16 DIAGNOSIS — N401 Enlarged prostate with lower urinary tract symptoms: Secondary | ICD-10-CM | POA: Diagnosis not present

## 2023-11-16 DIAGNOSIS — R351 Nocturia: Secondary | ICD-10-CM | POA: Diagnosis not present
